# Patient Record
Sex: Male | Born: 1990 | Race: White | Hispanic: No | Marital: Single | Smoking: Former smoker
Health system: Southern US, Community
[De-identification: ages and names within clinical notes are randomized; demographics above are authoritative.]

## PROBLEM LIST (undated history)

## (undated) DIAGNOSIS — R159 Full incontinence of feces: Secondary | ICD-10-CM

## (undated) DIAGNOSIS — F419 Anxiety disorder, unspecified: Secondary | ICD-10-CM

## (undated) DIAGNOSIS — Q431 Hirschsprung's disease: Secondary | ICD-10-CM

## (undated) DIAGNOSIS — A6000 Herpesviral infection of urogenital system, unspecified: Secondary | ICD-10-CM

## (undated) DIAGNOSIS — R569 Unspecified convulsions: Secondary | ICD-10-CM

## (undated) HISTORY — DX: Hirschsprung's disease: Q43.1

## (undated) HISTORY — DX: Anxiety disorder, unspecified: F41.9

## (undated) HISTORY — PX: COLON SURGERY: SHX602

## (undated) HISTORY — DX: Full incontinence of feces: R15.9

## (undated) HISTORY — DX: Herpesviral infection of urogenital system, unspecified: A60.00

---
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
## 2012-08-20 ENCOUNTER — Encounter (HOSPITAL_COMMUNITY): Payer: Self-pay | Admitting: *Deleted

## 2012-08-20 ENCOUNTER — Emergency Department (HOSPITAL_COMMUNITY)
Admission: EM | Admit: 2012-08-20 | Discharge: 2012-08-20 | Disposition: A | Discharge status: Home / Self Care | Payer: BC Managed Care – PPO | Attending: Emergency Medicine | Admitting: Emergency Medicine

## 2012-08-20 ENCOUNTER — Emergency Department (HOSPITAL_COMMUNITY): Payer: BC Managed Care – PPO

## 2012-08-20 DIAGNOSIS — R569 Unspecified convulsions: Secondary | ICD-10-CM

## 2012-08-20 DIAGNOSIS — Z79899 Other long term (current) drug therapy: Secondary | ICD-10-CM | POA: Insufficient documentation

## 2012-08-20 DIAGNOSIS — G40909 Epilepsy, unspecified, not intractable, without status epilepticus: Secondary | ICD-10-CM | POA: Insufficient documentation

## 2012-08-20 DIAGNOSIS — S01111A Laceration without foreign body of right eyelid and periocular area, initial encounter: Secondary | ICD-10-CM

## 2012-08-20 DIAGNOSIS — R5381 Other malaise: Secondary | ICD-10-CM | POA: Insufficient documentation

## 2012-08-20 DIAGNOSIS — S8000XA Contusion of unspecified knee, initial encounter: Secondary | ICD-10-CM | POA: Insufficient documentation

## 2012-08-20 DIAGNOSIS — Y9289 Other specified places as the place of occurrence of the external cause: Secondary | ICD-10-CM | POA: Insufficient documentation

## 2012-08-20 DIAGNOSIS — S0990XA Unspecified injury of head, initial encounter: Secondary | ICD-10-CM | POA: Insufficient documentation

## 2012-08-20 DIAGNOSIS — S058X9A Other injuries of unspecified eye and orbit, initial encounter: Secondary | ICD-10-CM | POA: Insufficient documentation

## 2012-08-20 DIAGNOSIS — Y9301 Activity, walking, marching and hiking: Secondary | ICD-10-CM | POA: Insufficient documentation

## 2012-08-20 DIAGNOSIS — R296 Repeated falls: Secondary | ICD-10-CM | POA: Insufficient documentation

## 2012-08-20 HISTORY — DX: Unspecified convulsions: R56.9

## 2012-08-20 LAB — CBC WITH DIFFERENTIAL/PLATELET
Basophils Absolute: 0 10*3/uL (ref 0.0–0.1)
Basophils Relative: 1 % (ref 0–1)
Eosinophils Absolute: 0.1 10*3/uL (ref 0.0–0.7)
Eosinophils Relative: 2 % (ref 0–5)
Lymphocytes Relative: 25 % (ref 12–46)
Lymphs Abs: 1.4 10*3/uL (ref 0.7–4.0)
Monocytes Absolute: 0.6 10*3/uL (ref 0.1–1.0)
Monocytes Relative: 11 % (ref 3–12)
Neutro Abs: 3.6 10*3/uL (ref 1.7–7.7)
RBC: 5.05 MIL/uL (ref 4.22–5.81)
WBC: 5.8 10*3/uL (ref 4.0–10.5)

## 2012-08-20 LAB — COMPREHENSIVE METABOLIC PANEL
ALT: 36 U/L (ref 0–53)
AST: 42 U/L — ABNORMAL HIGH (ref 0–37)
Albumin: 3.7 g/dL (ref 3.5–5.2)
Alkaline Phosphatase: 45 U/L (ref 39–117)
Creatinine, Ser: 0.94 mg/dL (ref 0.50–1.35)
GFR calc Af Amer: >90 mL/min (ref >90)
GFR calc non Af Amer: >90 mL/min (ref >90)
Glucose, Bld: 89 mg/dL (ref 70–99)
Potassium: 4.3 mEq/L (ref 3.5–5.1)
Total Bilirubin: 0.7 mg/dL (ref 0.3–1.2)

## 2012-08-20 MED ORDER — LEVETIRACETAM 1000 MG PO TABS: 1000.0000 mg | ORAL_TABLET | Freq: Two times a day (BID) | ORAL | Status: DC

## 2012-08-20 MED ORDER — TETANUS-DIPHTH-ACELL PERTUSSIS 5-2.5-18.5 LF-MCG/0.5 IM SUSP
0.5000 mL | Freq: Once | INTRAMUSCULAR | Status: DC
  Filled 2012-08-20: qty 0.5

## 2012-08-20 MED ORDER — LIDOCAINE-EPINEPHRINE-TETRACAINE (LET) SOLUTION
3.0000 mL | Freq: Once | NASAL | Status: AC
  Administered 2012-08-20: 3 mL via TOPICAL
  Filled 2012-08-20: qty 3

## 2012-08-20 MED ORDER — LEVETIRACETAM 500 MG PO TABS: 500.0000 mg | ORAL_TABLET | Freq: Once | ORAL | Status: DC

## 2012-08-20 MED ORDER — TETANUS-DIPHTHERIA TOXOIDS TD 5-2 LFU IM INJ
0.5000 mL | INJECTION | Freq: Once | INTRAMUSCULAR | Status: DC
  Administered 2012-08-20: 0.5 mL via INTRAMUSCULAR

## 2012-08-20 MED ORDER — LEVETIRACETAM 500 MG PO TABS
1000.0000 mg | ORAL_TABLET | Freq: Once | ORAL | Status: AC
  Administered 2012-08-20: 1000 mg via ORAL
  Filled 2012-08-20: qty 2

## 2012-08-20 NOTE — ED Notes (Signed)
Pt reports was at a friends house, got up this morning, was outside and had seizure. Hit concrete, laceration to right eye. Hx of seizures, last one a week ago. Medications changed 3 weeks ago. Pt now taking keppra, has had 3 seizures in last 3 weeks.

## 2012-08-20 NOTE — ED Notes (Signed)
Pt had syncopal episode while starting IV. Pt became pale, diaphoretic, hypotensive and bradycardic. Episode lasted 1-2 minutes. IV started and fluids began.

## 2012-08-20 NOTE — ED Provider Notes (Signed)
Medical screening examination/treatment/procedure(s) were conducted as a shared visit with non-physician practitioner(s) and myself.  I personally evaluated the patient during the encounter  Witnessed seizure with seizure disorder. States misses some vimpat and keppra once in a while. Head trauma after fall with eyelid laceration. EOMI, PERRLA, abrasion R eyelid without tarsal plate involvement. nonfocal neuro exam, baseline mentation.  Med adjustments as recommended by Dr. Roseanne Reno.  Glynn Octave, MD 08/20/12 [phone]

## 2012-08-20 NOTE — ED Notes (Signed)
PA at bedside.

## 2012-08-20 NOTE — ED Notes (Signed)
Pt assisted to restroom via wheelchair

## 2012-08-20 NOTE — ED Notes (Signed)
Pt remains in CT Scan.

## 2012-08-20 NOTE — ED Provider Notes (Signed)
History     CSN: [id number]  Arrival date & time 08/20/12  0736   First MD Initiated Contact with Patient 08/20/12 0740      Chief Complaint  Patient presents with  . Seizures  . Facial Laceration    (Consider location/radiation/quality/duration/timing/severity/associated sxs/prior treatment) HPI Roy Lloyd is a 22 y.o. male who presents to ED after an episode of a seizure. Pt states he spent the night at his friends house. This morning walked out outside where he had an episode of tonic clonic seizure. Hx of seizures, followed by [facility]. States has been on vimpat, and 3 wks ago had keppra added to his medications. Since then had 3 seizures, prior to that only 1 every few months. Pt states today he fell and hit his head and bilateral knees on the ground. Reports abrasions to the face, knees, headache. States feels tired. Denies any other complaints. Pt admits to intermittent non compliance with his medications.    Past Medical History  Diagnosis Date  . Seizures     Past Surgical History  Procedure Laterality Date  . Colon surgery      No family history on file.  History  Substance Use Topics  . Smoking status: Never Smoker   . Smokeless tobacco: Not on file  . Alcohol Use: No      Review of Systems  Constitutional: Positive for fatigue. Negative for fever and chills.  Respiratory: Negative.   Cardiovascular: Negative.   Skin: Positive for wound.  Neurological: Positive for seizures, weakness and headaches.  All other systems reviewed and are negative.    Allergies  Review of patient's allergies indicates no known allergies.  Home Medications   Current Outpatient Rx  Name  Route  Sig  Dispense  Refill  . lacosamide (VIMPAT) 50 MG TABS   Oral   Take 50-100 mg by mouth 2 (two) times daily. 50 mg in the morning and 100 mg at bedtime.         . levETIRAcetam (KEPPRA) 500 MG tablet   Oral   Take 500 mg by mouth 2 (two) times daily. 500 mg twice  daily for 7 days then 550 mg twice daily.           BP 112/55  Pulse 65  Temp(Src) 98 F (36.7 C) (Oral)  Resp 7  SpO2 97%  Physical Exam  Nursing note and vitals reviewed. Constitutional: He is oriented to person, place, and time. He appears well-developed and well-nourished. No distress.  HENT:  Head: Normocephalic.  No tongue injury  Eyes: Conjunctivae and EOM are normal. Pupils are equal, round, and reactive to light.  Neck: Neck supple.  Cardiovascular: Normal rate, regular rhythm and normal heart sounds.   Pulmonary/Chest: Effort normal and breath sounds normal. No respiratory distress. He has no wheezes. He has no rales.  Abdominal: Soft. Bowel sounds are normal. He exhibits no distension. There is no tenderness. There is no rebound.  Musculoskeletal:  Bruising to bilateral knees. Full rom. Tender to palpation over left anterior knee. Joint is stable with negative anterior, posterior drawer signs. No laxity with medial or lateral stress.   Neurological: He is alert and oriented to person, place, and time.  5/5 and equal upper and lower extremity strength bilaterally. Equal grip strength bilaterally. Normal finger to nose and heel to shin. No pronator drift.   Skin: Skin is warm and dry.  Abrasion to the right eyelid, right maxilla, bilateral knees   Psychiatric: He has  a normal mood and affect. His behavior is normal.    ED Course  Procedures (including critical care time)  Results for orders placed during the hospital encounter of 08/20/12  CBC WITH DIFFERENTIAL      Result Value Range   WBC 5.8  4.0 - 10.5 K/uL   RBC 5.05  4.22 - 5.81 MIL/uL   Hemoglobin 15.4  13.0 - 17.0 g/dL   HCT 10.2  72.5 - 36.6 %   MCV 86.9  78.0 - 100.0 fL   MCH 30.5  26.0 - 34.0 pg   MCHC 35.1  30.0 - 36.0 g/dL   RDW 44.0  34.7 - 42.5 %   Platelets 169  150 - 400 K/uL   Neutrophils Relative % 62  43 - 77 %   Neutro Abs 3.6  1.7 - 7.7 K/uL   Lymphocytes Relative 25  12 - 46 %    Lymphs Abs 1.4  0.7 - 4.0 K/uL   Monocytes Relative 11  3 - 12 %   Monocytes Absolute 0.6  0.1 - 1.0 K/uL   Eosinophils Relative 2  0 - 5 %   Eosinophils Absolute 0.1  0.0 - 0.7 K/uL   Basophils Relative 1  0 - 1 %   Basophils Absolute 0.0  0.0 - 0.1 K/uL  COMPREHENSIVE METABOLIC PANEL      Result Value Range   Sodium 137  135 - 145 mEq/L   Potassium 4.3  3.5 - 5.1 mEq/L   Chloride 104  96 - 112 mEq/L   CO2 24  19 - 32 mEq/L   Glucose, Bld 89  70 - 99 mg/dL   BUN 14  6 - 23 mg/dL   Creatinine, Ser 9.56  0.50 - 1.35 mg/dL   Calcium 9.3  8.4 - 38.7 mg/dL   Total Protein 6.4  6.0 - 8.3 g/dL   Albumin 3.7  3.5 - 5.2 g/dL   AST 42 (*) 0 - 37 U/L   ALT 36  0 - 53 U/L   Alkaline Phosphatase 45  39 - 117 U/L   Total Bilirubin 0.7  0.3 - 1.2 mg/dL   GFR calc non Af Amer >90  >90 mL/min   GFR calc Af Amer >90  >90 mL/min   Ct Head Wo Contrast  08/20/2012   *RADIOLOGY REPORT*  Clinical Data: Seizure, facial laceration  CT HEAD WITHOUT CONTRAST,CT ORBITS WITHOUT CONTRAST  Technique:  Contiguous axial images were obtained from the base of the skull through the vertex without contrast.,Technique: Multidetector CT imaging of the orbits was performed following the standard protocol without intravenous contrast.  Comparison: 06/27/2009  Findings: No skull fracture is noted.  Paranasal sinuses and mastoid air cells are unremarkable.  No intracranial hemorrhage, mass effect or midline shift.  No acute infarction.  No mass lesion is noted on this unenhanced scan.  IMPRESSION: No acute intracranial abnormality.  CT orbits without IV contrast.  Axial images shows no facial fractures.  No nasal bone fracture. Mild right preorbital soft tissue swelling.  No facial fluid collection.  No zygomatic fracture.  Coronal reconstructed images shows no orbital rim or orbital floor fracture.  There is left nasal septum deviation.  Bilateral semilunar canal is patent.  Sagittal images shows the nasopharyngeal airway to be  patent.  Bilateral eye globe is symmetrical in appearance.  No intraorbital hematoma.  Impression: 1.  No intraorbital hematoma.  No orbital floor or orbital rim fracture.  Mild right preorbital soft tissue  swelling.  Bilateral eye globe is symmetrical in appearance. 2.  No nasal bone fracture.  No zygomatic fracture.   Original Report Authenticated By: Natasha Mead, M.D.   Dg Knee Complete 4 Views Left  08/20/2012   *RADIOLOGY REPORT*  Clinical Data: Fall and abrasions.  LEFT KNEE - COMPLETE 4+ VIEW  Comparison: 03/18/2006  Findings: Four views of the knee are negative for a fracture or dislocation.  No significant joint effusion.  Normal alignment.  IMPRESSION: No acute abnormality.   Original Report Authenticated By: Richarda Overlie, M.D.   Ct Orbitss W/o Cm  08/20/2012   *RADIOLOGY REPORT*  Clinical Data: Seizure, facial laceration  CT HEAD WITHOUT CONTRAST,CT ORBITS WITHOUT CONTRAST  Technique:  Contiguous axial images were obtained from the base of the skull through the vertex without contrast.,Technique: Multidetector CT imaging of the orbits was performed following the standard protocol without intravenous contrast.  Comparison: 06/27/2009  Findings: No skull fracture is noted.  Paranasal sinuses and mastoid air cells are unremarkable.  No intracranial hemorrhage, mass effect or midline shift.  No acute infarction.  No mass lesion is noted on this unenhanced scan.  IMPRESSION: No acute intracranial abnormality.  CT orbits without IV contrast.  Axial images shows no facial fractures.  No nasal bone fracture. Mild right preorbital soft tissue swelling.  No facial fluid collection.  No zygomatic fracture.  Coronal reconstructed images shows no orbital rim or orbital floor fracture.  There is left nasal septum deviation.  Bilateral semilunar canal is patent.  Sagittal images shows the nasopharyngeal airway to be patent.  Bilateral eye globe is symmetrical in appearance.  No intraorbital hematoma.  Impression: 1.   No intraorbital hematoma.  No orbital floor or orbital rim fracture.  Mild right preorbital soft tissue swelling.  Bilateral eye globe is symmetrical in appearance. 2.  No nasal bone fracture.  No zygomatic fracture.   Original Report Authenticated By: Natasha Mead, M.D.     1. Seizure   2. Minor head injury, initial encounter   3. Laceration of skin of right eyelid       MDM  Pt with hx of seizures, here with increased seizures and one this morning where he fell to the ground. States increased seizures since starting keppra 3 wks ago. Pt does admit non compliance with medications. On exam, no neuro deficits. Laceration repaired with dermabond. CTs head and orbits negative. Tetanus updated. Pt ambulatory with no distress. He is non toxic appearing. Denies alcohol or drug use.   Discussed pt with Dr. Roseanne Reno, neurology. Recommended compliance and increase keppra dose to 1000mg  BID. Discussed with pt and his family. Will d/c home with neurology follow up.   Filed Vitals:   08/20/12 0800 08/20/12 0815 08/20/12 1030 08/20/12 1115  BP: 116/56 112/55 112/59 110/48  Pulse: 58 65 58 58  Temp:    98.6 F (37 C)  TempSrc:    Oral  Resp: 7   18  SpO2: 97% 97% 96% 97%           Cason Luffman A Alnita Aybar, PA-C 08/20/12 1526

## 2015-01-27 ENCOUNTER — Encounter (HOSPITAL_COMMUNITY): Payer: Self-pay | Admitting: Emergency Medicine

## 2015-01-27 ENCOUNTER — Emergency Department (HOSPITAL_COMMUNITY)
Admission: EM | Admit: 2015-01-27 | Discharge: 2015-01-28 | Disposition: A | Discharge status: Home / Self Care | Payer: 59 | Attending: Emergency Medicine | Admitting: Emergency Medicine

## 2015-01-27 ENCOUNTER — Emergency Department (HOSPITAL_COMMUNITY): Payer: 59

## 2015-01-27 DIAGNOSIS — G40909 Epilepsy, unspecified, not intractable, without status epilepticus: Secondary | ICD-10-CM | POA: Insufficient documentation

## 2015-01-27 DIAGNOSIS — R000 Tachycardia, unspecified: Secondary | ICD-10-CM | POA: Diagnosis not present

## 2015-01-27 DIAGNOSIS — J159 Unspecified bacterial pneumonia: Secondary | ICD-10-CM | POA: Diagnosis not present

## 2015-01-27 DIAGNOSIS — E876 Hypokalemia: Secondary | ICD-10-CM

## 2015-01-27 DIAGNOSIS — Z79899 Other long term (current) drug therapy: Secondary | ICD-10-CM | POA: Insufficient documentation

## 2015-01-27 DIAGNOSIS — J189 Pneumonia, unspecified organism: Secondary | ICD-10-CM

## 2015-01-27 DIAGNOSIS — R509 Fever, unspecified: Secondary | ICD-10-CM | POA: Diagnosis present

## 2015-01-27 LAB — COMPREHENSIVE METABOLIC PANEL
ALT: 34 U/L (ref 17–63)
ANION GAP: 9 (ref 5–15)
AST: 25 U/L (ref 15–41)
Albumin: 3.6 g/dL (ref 3.5–5.0)
Alkaline Phosphatase: 47 U/L (ref 38–126)
BUN: 6 mg/dL (ref 6–20)
CALCIUM: 8.9 mg/dL (ref 8.9–10.3)
CHLORIDE: 101 mmol/L (ref 101–111)
CO2: 28 mmol/L (ref 22–32)
Creatinine, Ser: 1.03 mg/dL (ref 0.61–1.24)
GFR calc non Af Amer: >60 mL/min (ref >60)
Glucose, Bld: 111 mg/dL — ABNORMAL HIGH (ref 65–99)
Potassium: 3.1 mmol/L — ABNORMAL LOW (ref 3.5–5.1)
SODIUM: 138 mmol/L (ref 135–145)
Total Bilirubin: 0.6 mg/dL (ref 0.3–1.2)
Total Protein: 6.8 g/dL (ref 6.5–8.1)

## 2015-01-27 LAB — CBC WITH DIFFERENTIAL/PLATELET
BASOS PCT: 0 %
Basophils Absolute: 0 10*3/uL (ref 0.0–0.1)
Eosinophils Absolute: 0.1 10*3/uL (ref 0.0–0.7)
Eosinophils Relative: 1 %
HEMATOCRIT: 38.4 % — AB (ref 39.0–52.0)
HEMOGLOBIN: 12.7 g/dL — AB (ref 13.0–17.0)
LYMPHS ABS: 1.4 10*3/uL (ref 0.7–4.0)
Lymphocytes Relative: 16 %
MCH: 30.6 pg (ref 26.0–34.0)
MCHC: 33.1 g/dL (ref 30.0–36.0)
MCV: 92.5 fL (ref 78.0–100.0)
MONOS PCT: 9 %
Monocytes Absolute: 0.8 10*3/uL (ref 0.1–1.0)
NEUTROS ABS: 6.8 10*3/uL (ref 1.7–7.7)
NEUTROS PCT: 74 %
Platelets: 212 10*3/uL (ref 150–400)
RBC: 4.15 MIL/uL — ABNORMAL LOW (ref 4.22–5.81)
RDW: 12.7 % (ref 11.5–15.5)
WBC: 9.1 10*3/uL (ref 4.0–10.5)

## 2015-01-27 LAB — URINALYSIS, ROUTINE W REFLEX MICROSCOPIC
BILIRUBIN URINE: NEGATIVE
Glucose, UA: NEGATIVE mg/dL
Hgb urine dipstick: NEGATIVE
Ketones, ur: NEGATIVE mg/dL
LEUKOCYTES UA: NEGATIVE
NITRITE: NEGATIVE
PH: 7 (ref 5.0–8.0)
Protein, ur: NEGATIVE mg/dL
SPECIFIC GRAVITY, URINE: 1.023 (ref 1.005–1.030)

## 2015-01-27 LAB — I-STAT CG4 LACTIC ACID, ED: Lactic Acid, Venous: 1.83 mmol/L (ref 0.5–2.0)

## 2015-01-27 MED ORDER — CEFTRIAXONE SODIUM 1 G IJ SOLR
1.0000 g | Freq: Once | INTRAMUSCULAR | Status: AC
  Administered 2015-01-27: 1 g via INTRAVENOUS
  Filled 2015-01-27: qty 10

## 2015-01-27 MED ORDER — POTASSIUM CHLORIDE CRYS ER 20 MEQ PO TBCR
40.0000 meq | EXTENDED_RELEASE_TABLET | Freq: Once | ORAL | Status: AC
  Administered 2015-01-27: 40 meq via ORAL
  Filled 2015-01-27: qty 2

## 2015-01-27 MED ORDER — DEXTROSE 5 % IV SOLN
500.0000 mg | Freq: Once | INTRAVENOUS | Status: AC
  Administered 2015-01-27: 500 mg via INTRAVENOUS
  Filled 2015-01-27: qty 500

## 2015-01-27 MED ORDER — DEXTROSE 5 % IV SOLN: 500.0000 mg | INTRAVENOUS | Status: DC

## 2015-01-27 MED ORDER — DEXTROSE 5 % IV SOLN: 1.0000 g | INTRAVENOUS | Status: DC

## 2015-01-27 MED ORDER — SODIUM CHLORIDE 0.9 % IV BOLUS (SEPSIS)
1000.0000 mL | INTRAVENOUS | Status: AC
  Administered 2015-01-27 (×3): 1000 mL via INTRAVENOUS

## 2015-01-27 NOTE — ED Notes (Signed)
PA at bedside.

## 2015-01-27 NOTE — ED Notes (Signed)
C/o productive cough with yellow phlegm, sob, and fever x 4 days.  States initially had sore throat that has resolved.  Last took Tylenol 1000mg  30 min pta.

## 2015-01-27 NOTE — ED Provider Notes (Signed)
CSN: [id number]     Arrival date & time 01/27/15  2109 History   First MD Initiated Contact with Patient 01/27/15 2139     Chief Complaint  Patient presents with  . Cough  . Shortness of Breath  . Fever     (Consider location/radiation/quality/duration/timing/severity/associated sxs/prior Treatment) The history is provided by the patient and medical records. No language interpreter was used.     Roy Lloyd is a 24 y.o. male  with a hx of seizures presents to the Emergency Department complaining of gradual, persistent, progressively worsening cough productive of yellow phlem onset 4 days ago. Associated symptoms include SOB, fever, sore throat (resolved).  Pt reports difficulty sleeping due to cough.  Pt reports exposure to people with PNA and bronchitis in the last week. He reports taking ibuprofen at home with moderate fever relief. He reports palpitations, general illness.  Coughing seems to make his shortness of breath worse.  Pt denies chest pain, abdominal pain, nausea, vomiting, diarrhea, weakness, dizziness, syncope, dysuria, neck pain, neck stiffness.    Strep and Influenza test negative on Tues 01/22/15 at PCP.  Pt reports PCP called in amoxicillin with first dose on 01/24/15.   he has taken this as directed.    Past Medical History  Diagnosis Date  . Seizures [facility])    Past Surgical History  Procedure Laterality Date  . Colon surgery     No family history on file. Social History  Substance Use Topics  . Smoking status: Never Smoker   . Smokeless tobacco: None  . Alcohol Use: No    Review of Systems  Constitutional: Positive for fever and fatigue. Negative for chills, diaphoresis, appetite change and unexpected weight change.  HENT: Positive for congestion, postnasal drip, rhinorrhea, sinus pressure and sore throat. Negative for ear discharge, ear pain and mouth sores.   Eyes: Negative for visual disturbance.  Respiratory: Positive for cough, chest tightness and  shortness of breath. Negative for wheezing and stridor.   Cardiovascular: Negative for chest pain, palpitations and leg swelling.  Gastrointestinal: Negative for nausea, vomiting, abdominal pain, diarrhea and constipation.  Endocrine: Negative for polydipsia, polyphagia and polyuria.  Genitourinary: Negative for dysuria, urgency, frequency and hematuria.  Musculoskeletal: Negative for myalgias, back pain, arthralgias and neck stiffness.  Skin: Negative for rash.  Allergic/Immunologic: Negative for immunocompromised state.  Neurological: Positive for headaches ( generalized, throbbing). Negative for syncope, light-headedness and numbness.  Hematological: Negative for adenopathy. Does not bruise/bleed easily.  Psychiatric/Behavioral: Negative for sleep disturbance. The patient is not nervous/anxious.   All other systems reviewed and are negative.     Allergies  Review of patient's allergies indicates no known allergies.  Home Medications   Prior to Admission medications   Medication Sig Start Date End Date Taking? Authorizing Provider  lacosamide (VIMPAT) 50 MG TABS Take 50-100 mg by mouth 2 (two) times daily. 50 mg in the morning and 100 mg at bedtime.   Yes Historical Provider, MD  levETIRAcetam (KEPPRA) 500 MG tablet Take 500 mg by mouth See admin instructions. 500 mg in the morning and 40981 mg in the evening.   Yes Historical Provider, MD  azithromycin (ZITHROMAX) 250 MG tablet Take 1 tablet (250 mg total) by mouth daily. Take first 2 tablets together, then 1 every day until finished. 01/28/15   Wyett Narine, PA-C   BP 113/60 mmHg  Pulse 113  Temp(Src) 99.4 F (37.4 C) (Oral)  Resp 22  Wt 191 lb (86.637 kg)  SpO2 96% Physical  Exam  Constitutional: He is oriented to person, place, and time. He appears well-developed and well-nourished. No distress.  HENT:  Head: Normocephalic and atraumatic.  Right Ear: Tympanic membrane, external ear and ear canal normal.  Left Ear:  Tympanic membrane, external ear and ear canal normal.  Nose: Mucosal edema and rhinorrhea present. No epistaxis. Right sinus exhibits no maxillary sinus tenderness and no frontal sinus tenderness. Left sinus exhibits no maxillary sinus tenderness and no frontal sinus tenderness.  Mouth/Throat: Uvula is midline and mucous membranes are normal. Mucous membranes are not pale and not cyanotic. No oropharyngeal exudate, posterior oropharyngeal edema, posterior oropharyngeal erythema or tonsillar abscesses.  Eyes: Conjunctivae are normal. Pupils are equal, round, and reactive to light.  Neck: Normal range of motion and full passive range of motion without pain.  Cardiovascular: Regular rhythm, S1 normal, S2 normal, normal heart sounds and intact distal pulses.  Tachycardia present.   Pulses:      Radial pulses are 2+ on the right side, and 2+ on the left side.       Dorsalis pedis pulses are 2+ on the right side, and 2+ on the left side.  Pulmonary/Chest: Effort normal. No accessory muscle usage or stridor. Tachypnea noted. No respiratory distress. He has decreased breath sounds in the right middle field and the right lower field. He has no wheezes. He has no rhonchi. He has no rales. He exhibits no tenderness.  Focally decreased breath sounds in the right middle and right lower lobe  Abdominal: Soft. Bowel sounds are normal. He exhibits no distension. There is no tenderness.  Musculoskeletal: Normal range of motion. He exhibits no edema.  Lymphadenopathy:    He has no cervical adenopathy.  Neurological: He is alert and oriented to person, place, and time.  Skin: Skin is warm and dry. No rash noted. He is not diaphoretic.  Psychiatric: He has a normal mood and affect.  Nursing note and vitals reviewed.   ED Course  Procedures (including critical care time) Labs Review Labs Reviewed  COMPREHENSIVE METABOLIC PANEL - Abnormal; Notable for the following:    Potassium 3.1 (*)    Glucose, Bld 111 (*)     All other components within normal limits  CBC WITH DIFFERENTIAL/PLATELET - Abnormal; Notable for the following:    RBC 4.15 (*)    Hemoglobin 12.7 (*)    HCT 38.4 (*)    All other components within normal limits  URINE CULTURE  CULTURE, BLOOD (ROUTINE X 2)  CULTURE, BLOOD (ROUTINE X 2)  URINALYSIS, ROUTINE W REFLEX MICROSCOPIC (NOT AT [facility])  I-STAT CG4 LACTIC ACID, ED  I-STAT CG4 LACTIC ACID, ED    Imaging Review Dg Chest 2 View  01/27/2015  CLINICAL DATA:  Acute onset of productive cough, with shortness of breath, fever and sore throat. Initial encounter. EXAM: CHEST  2 VIEW COMPARISON:  None. FINDINGS: The lungs are well-aerated. Right middle lobe and left lower lobe opacities are compatible with multifocal pneumonia. There is no evidence of pleural effusion or pneumothorax. The heart is normal in size; the mediastinal contour is within normal limits. No acute osseous abnormalities are seen. IMPRESSION: Right middle lobe and left lower lobe airspace opacities, compatible with multifocal pneumonia. Electronically Signed   By: Roanna RaiderJeffery  Chang M.D.   On: 01/27/2015 21:45   I have personally reviewed and evaluated these images and lab results as part of my medical decision-making.   MDM   Final diagnoses:  CAP (community acquired pneumonia)  Tachycardia  Hypokalemia    Otho Darner presents with fever, tachycardia, cough.  No leukocytosis, mild hypokalemia repleted in the emergency department. Chest x-ray with multifocal pneumonia. Lactic acid within normal limits.  Patient given a bolus, Rocephin, azithromycin and albuterol here in the emergency department with resolution of his fever. Mild tachycardia persists however he remains without hypoxia and tachypnea has resolved. Patient appears much better and reports feeling much better.  Patient with a community-acquired pneumonia. We'll discharge home with azithromycin and albuterol. Discussed reasons to return to the emergency  department including worsening fevers, crease difficult breathing or other concerns.  No evidence of SIRS or sepsis.    BP 113/60 mmHg  Pulse 113  Temp(Src) 99.4 F (37.4 C) (Oral)  Resp 22  Wt 191 lb (86.637 kg)  SpO2 96%   Dierdre Forth, PA-C 01/28/15 1610  Doug Sou, MD 01/28/15 1454

## 2015-01-28 LAB — URINE CULTURE: CULTURE: NO GROWTH

## 2015-01-28 LAB — I-STAT CG4 LACTIC ACID, ED: Lactic Acid, Venous: 1.74 mmol/L (ref 0.5–2.0)

## 2015-01-28 MED ORDER — ALBUTEROL SULFATE HFA 108 (90 BASE) MCG/ACT IN AERS
2.0000 | INHALATION_SPRAY | RESPIRATORY_TRACT | Status: DC | PRN
  Administered 2015-01-28: 2 via RESPIRATORY_TRACT
  Filled 2015-01-28: qty 6.7

## 2015-01-28 MED ORDER — AZITHROMYCIN 250 MG PO TABS: 250.0000 mg | ORAL_TABLET | Freq: Every day | ORAL | Status: DC

## 2015-01-28 MED ORDER — AEROCHAMBER PLUS W/MASK MISC
1.0000 | Freq: Once | Status: AC
  Administered 2015-01-28: 1
  Filled 2015-01-28: qty 1

## 2015-01-28 MED ORDER — ALBUTEROL SULFATE (2.5 MG/3ML) 0.083% IN NEBU
5.0000 mg | INHALATION_SOLUTION | Freq: Once | RESPIRATORY_TRACT | Status: AC
  Administered 2015-01-28: 5 mg via RESPIRATORY_TRACT
  Filled 2015-01-28: qty 6

## 2015-01-28 NOTE — Discharge Instructions (Signed)
1. Medications: Azithromycin, albuterol as needed for shortness of breath/chest tightness/wheezing, usual home medications 2. Treatment: rest, drink plenty of fluids 3. Follow Up: Please followup with your primary doctor in 3 days for discussion of your diagnoses and further evaluation after today's visit; if you do not have a primary care doctor use the resource guide provided to find one; Please return to the ER for worsening symptoms, difficult breathing or other concerns    Community-Acquired Pneumonia, Adult Pneumonia is an infection of the lungs. There are different types of pneumonia. One type can develop while a person is in a hospital. A different type, called community-acquired pneumonia, develops in people who are not, or have not recently been, in the hospital or other health care facility.  CAUSES Pneumonia may be caused by bacteria, viruses, or funguses. Community-acquired pneumonia is often caused by Streptococcus pneumonia bacteria. These bacteria are often passed from one person to another by breathing in droplets from the cough or sneeze of an infected person. RISK FACTORS The condition is more likely to develop in:  People who havechronic diseases, such as chronic obstructive pulmonary disease (COPD), asthma, congestive heart failure, cystic fibrosis, diabetes, or kidney disease.  People who haveearly-stage or late-stage HIV.  People who havesickle cell disease.  People who havehad their spleen removed (splenectomy).  People who havepoor Administrator.  People who havemedical conditions that increase the risk of breathing in (aspirating) secretions their own mouth and nose.   People who havea weakened immune system (immunocompromised).  People who smoke.  People whotravel to areas where pneumonia-causing germs commonly exist.  People whoare around animal habitats or animals that have pneumonia-causing germs, including birds, bats, rabbits, cats, and farm  animals. SYMPTOMS Symptoms of this condition include:  Adry cough.  A wet (productive) cough.  Fever.  Sweating.  Chest pain, especially when breathing deeply or coughing.  Rapid breathing or difficulty breathing.  Shortness of breath.  Shaking chills.  Fatigue.  Muscle aches. DIAGNOSIS Your health care provider will take a medical history and perform a physical exam. You may also have other tests, including:  Imaging studies of your chest, including X-rays.  Tests to check your blood oxygen level and other blood gases.  Other tests on blood, mucus (sputum), fluid around your lungs (pleural fluid), and urine. If your pneumonia is severe, other tests may be done to identify the specific cause of your illness. TREATMENT The type of treatment that you receive depends on many factors, such as the cause of your pneumonia, the medicines you take, and other medical conditions that you have. For most adults, treatment and recovery from pneumonia may occur at home. In some cases, treatment must happen in a hospital. Treatment may include:  Antibiotic medicines, if the pneumonia was caused by bacteria.  Antiviral medicines, if the pneumonia was caused by a virus.  Medicines that are given by mouth or through an IV tube.  Oxygen.  Respiratory therapy. Although rare, treating severe pneumonia may include:  Mechanical ventilation. This is done if you are not breathing well on your own and you cannot maintain a safe blood oxygen level.  Thoracentesis. This procedureremoves fluid around one lung or both lungs to help you breathe better. HOME CARE INSTRUCTIONS  Take over-the-counter and prescription medicines only as told by your health care provider.  Only takecough medicine if you are losing sleep. Understand that cough medicine can prevent your body's natural ability to remove mucus from your lungs.  If you were prescribed  an antibiotic medicine, take it as told by your  health care provider. Do not stop taking the antibiotic even if you start to feel better.  Sleep in a semi-upright position at night. Try sleeping in a reclining chair, or place a few pillows under your head.  Do not use tobacco products, including cigarettes, chewing tobacco, and e-cigarettes. If you need help quitting, ask your health care provider.  Drink enough water to keep your urine clear or pale yellow. This will help to thin out mucus secretions in your lungs. PREVENTION There are ways that you can decrease your risk of developing community-acquired pneumonia. Consider getting a pneumococcal vaccine if:  You are older than 23 years of age.  You are older than 23 years of age and are undergoing cancer treatment, have chronic lung disease, or have other medical conditions that affect your immune system. Ask your health care provider if this applies to you. There are different types and schedules of pneumococcal vaccines. Ask your health care provider which vaccination option is best for you. You may also prevent community-acquired pneumonia if you take these actions:  Get an influenza vaccine every year. Ask your health care provider which type of influenza vaccine is best for you.  Go to the dentist on a regular basis.  Wash your hands often. Use hand sanitizer if soap and water are not available. SEEK MEDICAL CARE IF:  You have a fever.  You are losing sleep because you cannot control your cough with cough medicine. SEEK IMMEDIATE MEDICAL CARE IF:  You have worsening shortness of breath.  You have increased chest pain.  Your sickness becomes worse, especially if you are an older adult or have a weakened immune system.  You cough up blood.   This information is not intended to replace advice given to you by your health care provider. Make sure you discuss any questions you have with your health care provider.   Document Released: 02/23/2005 Document Revised: 11/14/2014  Document Reviewed: 06/20/2014 Elsevier Interactive Patient Education [employer]2016 [employer] Inc.

## 2015-02-01 LAB — CULTURE, BLOOD (ROUTINE X 2)
CULTURE: NO GROWTH
Culture: NO GROWTH

## 2015-09-14 ENCOUNTER — Encounter (HOSPITAL_COMMUNITY): Payer: Self-pay | Admitting: *Deleted

## 2015-09-14 ENCOUNTER — Emergency Department (HOSPITAL_COMMUNITY)
Admission: EM | Admit: 2015-09-14 | Discharge: 2015-09-15 | Disposition: A | Discharge status: Home / Self Care | Payer: Self-pay | Attending: Emergency Medicine | Admitting: Emergency Medicine

## 2015-09-14 DIAGNOSIS — Y999 Unspecified external cause status: Secondary | ICD-10-CM | POA: Insufficient documentation

## 2015-09-14 DIAGNOSIS — Y939 Activity, unspecified: Secondary | ICD-10-CM | POA: Insufficient documentation

## 2015-09-14 DIAGNOSIS — R51 Headache: Secondary | ICD-10-CM

## 2015-09-14 DIAGNOSIS — R519 Headache, unspecified: Secondary | ICD-10-CM

## 2015-09-14 DIAGNOSIS — X58XXXA Exposure to other specified factors, initial encounter: Secondary | ICD-10-CM | POA: Insufficient documentation

## 2015-09-14 DIAGNOSIS — Y92002 Bathroom of unspecified non-institutional (private) residence single-family (private) house as the place of occurrence of the external cause: Secondary | ICD-10-CM | POA: Insufficient documentation

## 2015-09-14 DIAGNOSIS — S0093XA Contusion of unspecified part of head, initial encounter: Secondary | ICD-10-CM | POA: Insufficient documentation

## 2015-09-14 DIAGNOSIS — G40909 Epilepsy, unspecified, not intractable, without status epilepticus: Secondary | ICD-10-CM | POA: Insufficient documentation

## 2015-09-14 NOTE — ED Notes (Signed)
Pt c/o migraine headache and has been vomiting. Pt says that he also has been having abdominal bloating for several days and abdominal pain today, that resolved after vomiting. Pt states he drank 1 40 oz beer today.
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
## 2015-09-15 ENCOUNTER — Emergency Department (HOSPITAL_COMMUNITY): Payer: Self-pay

## 2015-09-15 LAB — COMPREHENSIVE METABOLIC PANEL
ALK PHOS: 32 U/L — AB (ref 38–126)
ALT: 37 U/L (ref 17–63)
AST: 31 U/L (ref 15–41)
Albumin: 3.5 g/dL (ref 3.5–5.0)
Anion gap: 9 (ref 5–15)
BILIRUBIN TOTAL: 0.7 mg/dL (ref 0.3–1.2)
BUN: 10 mg/dL (ref 6–20)
CO2: 25 mmol/L (ref 22–32)
Calcium: 9.1 mg/dL (ref 8.9–10.3)
Chloride: 103 mmol/L (ref 101–111)
Creatinine, Ser: 1.6 mg/dL — ABNORMAL HIGH (ref 0.61–1.24)
GFR, EST NON AFRICAN AMERICAN: 59 mL/min — AB (ref >60)
Glucose, Bld: 94 mg/dL (ref 65–99)
Potassium: 4 mmol/L (ref 3.5–5.1)
SODIUM: 137 mmol/L (ref 135–145)
Total Protein: 6.5 g/dL (ref 6.5–8.1)

## 2015-09-15 LAB — CBC
HCT: 46.6 % (ref 39.0–52.0)
HEMOGLOBIN: 15.2 g/dL (ref 13.0–17.0)
MCH: 30.6 pg (ref 26.0–34.0)
MCHC: 32.6 g/dL (ref 30.0–36.0)
MCV: 93.8 fL (ref 78.0–100.0)
Platelets: 240 10*3/uL (ref 150–400)
RBC: 4.97 MIL/uL (ref 4.22–5.81)
RDW: 14 % (ref 11.5–15.5)
WBC: 9 10*3/uL (ref 4.0–10.5)

## 2015-09-15 LAB — ETHANOL: Alcohol, Ethyl (B): <5 mg/dL (ref <5)

## 2015-09-15 LAB — LIPASE, BLOOD: LIPASE: 22 U/L (ref 11–51)

## 2015-09-15 MED ORDER — SODIUM CHLORIDE 0.9 % IV BOLUS (SEPSIS)
1000.0000 mL | Freq: Once | INTRAVENOUS | Status: AC
  Administered 2015-09-15: 1000 mL via INTRAVENOUS

## 2015-09-15 MED ORDER — METOCLOPRAMIDE HCL 5 MG/ML IJ SOLN
10.0000 mg | Freq: Once | INTRAMUSCULAR | Status: AC
  Administered 2015-09-15: 10 mg via INTRAVENOUS
  Filled 2015-09-15: qty 2

## 2015-09-15 MED ORDER — DIPHENHYDRAMINE HCL 50 MG/ML IJ SOLN
25.0000 mg | Freq: Once | INTRAMUSCULAR | Status: AC
  Administered 2015-09-15: 25 mg via INTRAVENOUS
  Filled 2015-09-15: qty 1

## 2015-09-15 MED ORDER — ONDANSETRON HCL 4 MG/2ML IJ SOLN
4.0000 mg | Freq: Once | INTRAMUSCULAR | Status: AC
Start: 2015-09-15 — End: 2015-09-15
  Administered 2015-09-15: 4 mg via INTRAVENOUS
  Filled 2015-09-15: qty 2

## 2015-09-15 NOTE — Discharge Instructions (Signed)

## 2016-06-25 ENCOUNTER — Emergency Department (HOSPITAL_COMMUNITY)
Admission: EM | Admit: 2016-06-25 | Discharge: 2016-06-25 | Disposition: A | Discharge status: Home / Self Care | Payer: No Typology Code available for payment source | Attending: Emergency Medicine | Admitting: Emergency Medicine

## 2016-06-25 ENCOUNTER — Emergency Department (HOSPITAL_COMMUNITY): Payer: No Typology Code available for payment source

## 2016-06-25 ENCOUNTER — Encounter (HOSPITAL_COMMUNITY): Payer: Self-pay | Admitting: Emergency Medicine

## 2016-06-25 DIAGNOSIS — Y999 Unspecified external cause status: Secondary | ICD-10-CM | POA: Diagnosis not present

## 2016-06-25 DIAGNOSIS — R569 Unspecified convulsions: Secondary | ICD-10-CM | POA: Diagnosis not present

## 2016-06-25 DIAGNOSIS — Y939 Activity, unspecified: Secondary | ICD-10-CM | POA: Insufficient documentation

## 2016-06-25 DIAGNOSIS — S99921A Unspecified injury of right foot, initial encounter: Secondary | ICD-10-CM | POA: Diagnosis present

## 2016-06-25 DIAGNOSIS — M79671 Pain in right foot: Secondary | ICD-10-CM

## 2016-06-25 DIAGNOSIS — Y92410 Unspecified street and highway as the place of occurrence of the external cause: Secondary | ICD-10-CM | POA: Insufficient documentation

## 2016-06-25 DIAGNOSIS — F17210 Nicotine dependence, cigarettes, uncomplicated: Secondary | ICD-10-CM | POA: Diagnosis not present

## 2016-06-25 LAB — I-STAT CHEM 8, ED
BUN: 11 mg/dL (ref 6–20)
CHLORIDE: 103 mmol/L (ref 101–111)
CREATININE: 1.2 mg/dL (ref 0.61–1.24)
Calcium, Ion: 1.17 mmol/L (ref 1.15–1.40)
GLUCOSE: 100 mg/dL — AB (ref 65–99)
HEMATOCRIT: 47 % (ref 39.0–52.0)
HEMOGLOBIN: 16 g/dL (ref 13.0–17.0)
Potassium: 4.5 mmol/L (ref 3.5–5.1)
Sodium: 136 mmol/L (ref 135–145)
TCO2: 19 mmol/L (ref 0–100)

## 2016-06-25 MED ORDER — ACETAMINOPHEN 325 MG PO TABS
650.0000 mg | ORAL_TABLET | Freq: Once | ORAL | Status: AC
  Administered 2016-06-25: 650 mg via ORAL
  Filled 2016-06-25: qty 2

## 2016-06-25 MED ORDER — IBUPROFEN 400 MG PO TABS
600.0000 mg | ORAL_TABLET | Freq: Once | ORAL | Status: AC
  Administered 2016-06-25: 10:00:00 600 mg via ORAL
  Filled 2016-06-25: qty 1

## 2016-06-25 MED ORDER — IBUPROFEN 600 MG PO TABS: 600.0000 mg | ORAL_TABLET | Freq: Three times a day (TID) | ORAL | 0 refills | Status: DC | PRN

## 2016-06-25 MED ORDER — SODIUM CHLORIDE 0.9 % IV BOLUS (SEPSIS)
1000.0000 mL | Freq: Once | INTRAVENOUS | Status: AC
  Administered 2016-06-25: 1000 mL via INTRAVENOUS

## 2016-06-25 NOTE — ED Notes (Signed)
Pt returned to room from imaging department.  
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
## 2016-06-25 NOTE — Progress Notes (Signed)
Orthopedic Tech Progress Note Patient Details:  Roy Lloyd 1990/04/25 [id number]  Ortho Devices Type of Ortho Device: CAM walker, Crutches Ortho Device/Splint Location: rle Ortho Device/Splint Interventions: Application   Treyvonne Tata 06/25/2016, 11:07 AM

## 2016-06-25 NOTE — ED Notes (Signed)
Pt stated he needed to urinate. Pt was asked if he wanted to  Use urinal or walk to restroom. Pt attempt to stand on right foot, was unable. Pt sat back down to use urinal. Doctor and nurse made aware.
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
## 2016-06-25 NOTE — Discharge Instructions (Signed)
No weight bearing to the right lower leg. Use the crutches. Please call the orthopedic surgeon for follow up.   DO NOT drive until you follow up with your neurologist

## 2016-06-26 ENCOUNTER — Encounter (HOSPITAL_COMMUNITY): Payer: Self-pay | Admitting: *Deleted

## 2016-07-17 ENCOUNTER — Inpatient Hospital Stay: Payer: Self-pay
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
## 2016-08-21 ENCOUNTER — Emergency Department (HOSPITAL_COMMUNITY): Payer: Self-pay

## 2016-08-21 ENCOUNTER — Emergency Department (HOSPITAL_COMMUNITY)
Admission: EM | Admit: 2016-08-21 | Discharge: 2016-08-21 | Disposition: A | Discharge status: Home / Self Care | Payer: Self-pay | Attending: Emergency Medicine | Admitting: Emergency Medicine

## 2016-08-21 ENCOUNTER — Encounter (HOSPITAL_COMMUNITY): Payer: Self-pay | Admitting: *Deleted

## 2016-08-21 DIAGNOSIS — Z79899 Other long term (current) drug therapy: Secondary | ICD-10-CM | POA: Insufficient documentation

## 2016-08-21 DIAGNOSIS — F17210 Nicotine dependence, cigarettes, uncomplicated: Secondary | ICD-10-CM | POA: Insufficient documentation

## 2016-08-21 DIAGNOSIS — R1013 Epigastric pain: Secondary | ICD-10-CM | POA: Insufficient documentation

## 2016-08-21 LAB — URINALYSIS, ROUTINE W REFLEX MICROSCOPIC
GLUCOSE, UA: NEGATIVE mg/dL
Hgb urine dipstick: NEGATIVE
Ketones, ur: 80 mg/dL — AB
LEUKOCYTES UA: NEGATIVE
Nitrite: NEGATIVE
PROTEIN: 100 mg/dL — AB
SQUAMOUS EPITHELIAL / LPF: NONE SEEN
Specific Gravity, Urine: 1.033 — ABNORMAL HIGH (ref 1.005–1.030)
pH: 6 (ref 5.0–8.0)

## 2016-08-21 LAB — COMPREHENSIVE METABOLIC PANEL
ALK PHOS: 60 U/L (ref 38–126)
ALT: 16 U/L — AB (ref 17–63)
AST: 25 U/L (ref 15–41)
Albumin: 5 g/dL (ref 3.5–5.0)
Anion gap: 16 — ABNORMAL HIGH (ref 5–15)
BILIRUBIN TOTAL: 1.9 mg/dL — AB (ref 0.3–1.2)
BUN: 17 mg/dL (ref 6–20)
CALCIUM: 10.1 mg/dL (ref 8.9–10.3)
CHLORIDE: 103 mmol/L (ref 101–111)
CO2: 19 mmol/L — ABNORMAL LOW (ref 22–32)
CREATININE: 1.4 mg/dL — AB (ref 0.61–1.24)
GFR calc Af Amer: >60 mL/min (ref >60)
Glucose, Bld: 99 mg/dL (ref 65–99)
Potassium: 3.4 mmol/L — ABNORMAL LOW (ref 3.5–5.1)
Sodium: 138 mmol/L (ref 135–145)
TOTAL PROTEIN: 8 g/dL (ref 6.5–8.1)

## 2016-08-21 LAB — I-STAT CG4 LACTIC ACID, ED: LACTIC ACID, VENOUS: 1.69 mmol/L (ref 0.5–1.9)

## 2016-08-21 LAB — RAPID URINE DRUG SCREEN, HOSP PERFORMED
Amphetamines: NOT DETECTED
BARBITURATES: NOT DETECTED
BENZODIAZEPINES: NOT DETECTED
COCAINE: NOT DETECTED
Opiates: NOT DETECTED
Tetrahydrocannabinol: POSITIVE — AB

## 2016-08-21 LAB — CBC
HCT: 44.9 % (ref 39.0–52.0)
Hemoglobin: 16.4 g/dL (ref 13.0–17.0)
MCH: 31.6 pg (ref 26.0–34.0)
MCHC: 36.5 g/dL — ABNORMAL HIGH (ref 30.0–36.0)
MCV: 86.5 fL (ref 78.0–100.0)
PLATELETS: 264 10*3/uL (ref 150–400)
RBC: 5.19 MIL/uL (ref 4.22–5.81)
RDW: 11.8 % (ref 11.5–15.5)
WBC: 13 10*3/uL — AB (ref 4.0–10.5)

## 2016-08-21 LAB — LIPASE, BLOOD: Lipase: 26 U/L (ref 11–51)

## 2016-08-21 LAB — ETHANOL: Alcohol, Ethyl (B): <5 mg/dL (ref <5)

## 2016-08-21 MED ORDER — ONDANSETRON 4 MG PO TBDP
ORAL_TABLET | ORAL | Status: AC
  Filled 2016-08-21: qty 1

## 2016-08-21 MED ORDER — SODIUM CHLORIDE 0.9 % IV BOLUS (SEPSIS)
1000.0000 mL | Freq: Once | INTRAVENOUS | Status: AC
  Administered 2016-08-21: 1000 mL via INTRAVENOUS

## 2016-08-21 MED ORDER — ONDANSETRON HCL 4 MG PO TABS: 4.0000 mg | ORAL_TABLET | Freq: Three times a day (TID) | ORAL | 0 refills | Status: DC | PRN

## 2016-08-21 MED ORDER — OXYCODONE-ACETAMINOPHEN 5-325 MG PO TABS
ORAL_TABLET | ORAL | Status: AC
  Filled 2016-08-21: qty 1

## 2016-08-21 MED ORDER — DEXLANSOPRAZOLE 30 MG PO CPDR: 30.0000 mg | DELAYED_RELEASE_CAPSULE | Freq: Every day | ORAL | 1 refills | Status: DC

## 2016-08-21 MED ORDER — DICYCLOMINE HCL 20 MG PO TABS: 20.0000 mg | ORAL_TABLET | Freq: Two times a day (BID) | ORAL | 0 refills | Status: DC

## 2016-08-21 MED ORDER — SUCRALFATE 1 G PO TABS: 1.0000 g | ORAL_TABLET | Freq: Three times a day (TID) | ORAL | 1 refills | Status: DC

## 2016-08-21 MED ORDER — IOPAMIDOL (ISOVUE-300) INJECTION 61%
INTRAVENOUS | Status: AC
  Administered 2016-08-21: 100 mL
  Filled 2016-08-21: qty 100

## 2016-08-21 MED ORDER — LORAZEPAM 2 MG/ML IJ SOLN
1.0000 mg | Freq: Once | INTRAMUSCULAR | Status: AC
  Administered 2016-08-21: 1 mg via INTRAVENOUS
  Filled 2016-08-21: qty 1

## 2016-08-21 NOTE — ED Notes (Signed)
Patient transported to CT 

## 2016-08-21 NOTE — ED Notes (Signed)
Pt reports indigestion every morning for the past month.  Pt states he has tried OTC tums/acid reducers w/o relief.  Pt also states that he has had the chills intermittently as well as severe fatigue.  Pt states, "I cannot walk to the door without getting winded."

## 2016-08-21 NOTE — Discharge Instructions (Signed)
Abdominal (belly) pain can be caused by many things. Your caregiver performed an examination and possibly ordered blood/urine tests and imaging (CT scan, x-rays, ultrasound). Many cases can be observed and treated at home after initial evaluation in the emergency department. Even though you are being discharged home, abdominal pain can be unpredictable. Therefore, you need a repeated exam if your pain does not resolve, returns, or worsens. Most patients with abdominal pain don't have to be admitted to the hospital or have surgery, but serious problems like appendicitis and gallbladder attacks can start out as nonspecific pain. Many abdominal conditions cannot be diagnosed in one visit, so follow-up evaluations are very important. SEEK IMMEDIATE MEDICAL ATTENTION IF: The pain does not go away or becomes severe.  A temperature above 101 develops.  Repeated vomiting occurs (multiple episodes).  The pain becomes localized to portions of the abdomen. The right side could possibly be appendicitis. In an adult, the left lower portion of the abdomen could be colitis or diverticulitis.  Blood is being passed in stools or vomit (bright red or black tarry stools).  Return also if you develop chest pain, difficulty breathing, dizziness or fainting, or become confused, poorly responsive, or inconsolable (young children).  Free HIV and STD Testing These locations offer FREE confidential testing for HIV, Chlamydia, Gonorrhea, and Syphilis. Non-Traditional Testing Sites Address Telephone  [facility] [address], [place] [phone] Mondays 5pm - 7pm  [facility] [address] [place] [phone] Wednesdays 2pm-8pm  [facility] [address], [place] [phone] Thursdays 9am-12noon 1pm-4pm  [facility] [address],  [place] [phone] Tuesdays Thursdays 9am-12noon 1pm-4pm  [facility] offers free, confidential testing and treatment for HIV, Chlamydia, Gonorrhea, Syphilis, Herpes, Bacterial Vaginosis, Yeast, and Trichomoniasis. Traditional Testing   [facility] [address], [place] [phone]  Monday thru Friday  Call for an appointment  [facility] [address] [place] [phone] Monday thru Friday  Call for anappointment.  If you have any questions about this information please call [phone]. 01/15/2011

## 2016-08-21 NOTE — ED Triage Notes (Signed)
Pt arrives with c/o abdominal pain that has been intermittent x 54month and then became severe today. Pt endorses n/v and severe LUQ pain.

## 2016-08-21 NOTE — ED Provider Notes (Signed)
[facility]-EMERGENCY DEPT Provider Note   CSN: [id number] Arrival date & time: 08/21/16  1739     History   Chief Complaint Chief Complaint  Patient presents with  . Abdominal Pain    HPI Roy Lloyd is a 26 y.o. male with past medical history of Hirschsprung disease, seizures, who presents emergency Department with chief complaint of abdominal pain. The patient has had persistent and ongoing abdominal pain for the past month with associated nausea and vomiting. He states that he has barely been able to keep down any food or fluids. He complains that he has been losing weight. He states that he has been able to hold down his seizure medications. Today, he became tremulous. He feels associated chills. He had an episode of vomiting "pure blood" earlier today. He states that he feels like he has "severe acid going up and down his esophagus and complains that the pain is spreading along his left upper quadrant. He also feels as if he is going to pass out every time he stands up. He denies urinary symptoms, diarrhea, constipation.  HPI  Past Medical History:  Diagnosis Date  . Seizures (HCC)     There are no active problems to display for this patient.   Past Surgical History:  Procedure Laterality Date  . COLON SURGERY    . 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 Authorizing Provider  azithromycin (ZITHROMAX) 250 MG tablet Take 1 tablet (250 mg total) by mouth daily. Take first 2 tablets together, then 1 every day until finished. 01/28/15   Muthersbaugh, Dahlia ClientHannah, PA-C  ibuprofen (ADVIL,MOTRIN) 600 MG tablet Take 1 tablet (600 mg total) by mouth every 8 (eight) hours as needed. 06/25/16   Azalia Bilisampos, Kevin, MD  lacosamide (VIMPAT) 50 MG TABS Take 50-100 mg by mouth 2 (two) times daily. 50 mg in the morning and 100 mg at bedtime.    [provider]    levETIRAcetam (KEPPRA) 500 MG tablet Take 500 mg by mouth See admin instructions. 500 mg in the morning and 1000 mg in the evening.    [provider]  OVER THE COUNTER MEDICATION Take 1 Package by mouth daily as needed (FOR WORKING OUT).    [provider]  Protein POWD Take 1 scoop by mouth daily as needed (FOR WORKING OUT).    [provider]    Family History History reviewed. No pertinent family history.  Social History Social History  Substance Use Topics  . Smoking status: Current Every Day Smoker    Packs/day: 0.50    Types: Cigarettes  . Smokeless tobacco: Never Used  . Alcohol use 0.6 oz/week    1 Cans of beer per week     Allergies   Patient has no known allergies.   Review of Systems Review of Systems Ten systems reviewed and are negative for acute change, except as noted in the HPI.    Physical Exam Updated Vital Signs BP (!) 156/91   Pulse (!) 101   Temp 97.9 F (36.6 C) (Oral)   Resp (!) 23   Ht 5\' 10"  (1.778 m)   Wt 90.7 kg (200 lb)   SpO2 99%   BMI 28.70 kg/m   Physical Exam  Constitutional: He appears well-developed and well-nourished. No distress.  HENT:  Head: Normocephalic and atraumatic.  Eyes:  Conjunctivae are normal. No scleral icterus.  Neck: Normal range of motion. Neck supple.  Cardiovascular: Normal rate, regular rhythm and normal heart sounds.   Pulmonary/Chest: Effort normal and breath sounds normal. No respiratory distress.  Abdominal: Soft. He exhibits no distension. There is tenderness. There is guarding.  Musculoskeletal: He exhibits no edema.  Neurological: He is alert.  Skin: Skin is warm and dry. He is not diaphoretic.  Psychiatric: His behavior is normal.  Nursing note and vitals reviewed.    ED Treatments / Results  Labs (all labs ordered are listed, but only abnormal results are displayed) Labs Reviewed  LIPASE, BLOOD  COMPREHENSIVE METABOLIC PANEL  CBC  URINALYSIS, ROUTINE W REFLEX  MICROSCOPIC    EKG  EKG Interpretation None       Radiology No results found.  Procedures Procedures (including critical care time)  Medications Ordered in ED Medications  ondansetron (ZOFRAN-ODT) 4 MG disintegrating tablet (not administered)  oxyCODONE-acetaminophen (PERCOCET/ROXICET) 5-325 MG per tablet (not administered)     Initial Impression / Assessment and Plan / ED Course  I have reviewed the triage vital signs and the nursing notes.  Pertinent labs & imaging results that were available during my care of the patient were reviewed by me and considered in my medical decision making (see chart for details).     Patient workup negative for acute abnormality. He appears improved after pain medication and antianxiety medicine. His urine does appear contaminated and he also appears to be dehydrated. I discussed the possibility of cannabis induced hyperemesis. I have asked the patient to consider discontinuing use of marijuana as this may be contributing to his vomiting. I also have strong for consideration for potential ulcer and will treat the patient as such. He is advised to follow-up with gastroenterology. Tolerating by mouth fluids. Patient safe for discharge at this time.  Final Clinical Impressions(s) / ED Diagnoses   Final diagnoses:  Epigastric pain    New Prescriptions New Prescriptions   No medications on file     Arthor Captain, Cordelia Poche 08/26/16 1716    Mesner, Barbara Cower, MD 08/26/16 1859

## 2016-11-10 ENCOUNTER — Encounter: Payer: Self-pay | Admitting: Neurology

## 2016-11-13 ENCOUNTER — Encounter: Payer: Self-pay | Admitting: Neurology

## 2016-11-13 ENCOUNTER — Ambulatory Visit (INDEPENDENT_AMBULATORY_CARE_PROVIDER_SITE_OTHER): Payer: BLUE CROSS/BLUE SHIELD | Admitting: Neurology

## 2016-11-13 VITALS — BP 124/82 | HR 87 | Wt 217.0 lb

## 2016-11-13 DIAGNOSIS — G40019 Localization-related (focal) (partial) idiopathic epilepsy and epileptic syndromes with seizures of localized onset, intractable, without status epilepticus: Secondary | ICD-10-CM

## 2016-11-13 MED ORDER — ZONISAMIDE 100 MG PO CAPS: ORAL_CAPSULE | ORAL | 6 refills | Status: DC
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
## 2016-11-16 ENCOUNTER — Telehealth: Payer: Self-pay | Admitting: Neurology

## 2016-11-16 NOTE — Telephone Encounter (Signed)
Pt's mother Albin FellingCarla called and left a voicemail message asking for a call back in regards to some questions she had about his visit

## 2016-11-17 DIAGNOSIS — G40019 Localization-related (focal) (partial) idiopathic epilepsy and epileptic syndromes with seizures of localized onset, intractable, without status epilepticus: Secondary | ICD-10-CM | POA: Insufficient documentation

## 2016-11-17 NOTE — Telephone Encounter (Signed)
LMOM asking pt's mother to return my call.

## 2016-11-23 ENCOUNTER — Telehealth: Payer: Self-pay | Admitting: Neurology

## 2016-11-23 ENCOUNTER — Ambulatory Visit (INDEPENDENT_AMBULATORY_CARE_PROVIDER_SITE_OTHER): Payer: BLUE CROSS/BLUE SHIELD | Admitting: Neurology

## 2016-11-23 DIAGNOSIS — G40019 Localization-related (focal) (partial) idiopathic epilepsy and epileptic syndromes with seizures of localized onset, intractable, without status epilepticus: Secondary | ICD-10-CM | POA: Diagnosis not present

## 2016-11-23 NOTE — Telephone Encounter (Signed)
Handwritten note states:  "I'm a widow.  Bobby + 1 other son live with me.  Didn't want to say anything in front of my son + make him feel bad - but can you let the doctor know - I'm very worried about my son's memory loss.  It's been getting worse lately.  I'm very concerned.                                                              Roy Lloyd"

## 2016-11-23 NOTE — Telephone Encounter (Signed)
Pt's mother called and wanted to let Dr Karel Jarvis know that she observed her son having a seizure in his sleep over the weekend/Dawn

## 2016-11-23 NOTE — Telephone Encounter (Signed)
Patient mother came with patient to appt for EGG today and let me know that she is very concerned about patient memory loss. She did not want to say anything in front of the patient so she wrote it down on a note and handed it to me as he was getting the EGG. She wanted this information added to his chart if you have any questions you may call the mother at [phone]

## 2016-11-24 DIAGNOSIS — G40019 Localization-related (focal) (partial) idiopathic epilepsy and epileptic syndromes with seizures of localized onset, intractable, without status epilepticus: Secondary | ICD-10-CM | POA: Diagnosis not present

## 2016-11-24 NOTE — Telephone Encounter (Signed)
Noted  

## 2016-11-25 DIAGNOSIS — G40019 Localization-related (focal) (partial) idiopathic epilepsy and epileptic syndromes with seizures of localized onset, intractable, without status epilepticus: Secondary | ICD-10-CM | POA: Diagnosis not present

## 2016-11-26 ENCOUNTER — Telehealth: Payer: Self-pay | Admitting: Neurology

## 2016-11-26 NOTE — Telephone Encounter (Signed)
Noted.  Mother has let us know this information several times (although her being adopted is new news)    48 Hr EEG removed this morning, told pt, his mother and his brother that it will be about 2-3 weeks before I call them with the results.  They all expressed understanding.

## 2016-11-26 NOTE — Telephone Encounter (Signed)
Mother (carla) called regarding Kuz[place]. She said she is very concerned about her son. He has had more Seizures this year than last. Also, that he will forget he told her something and repeat it. She said he forgets a lot. She also said she is adopted and she does not know what is in her blood line but she is fearful of his situation. Please Advise. Thanks

## 2016-11-27 ENCOUNTER — Telehealth: Payer: Self-pay | Admitting: Neurology

## 2016-11-27 NOTE — Telephone Encounter (Signed)
Pt's mother called and wants to know if Dr Karel Jarvis can write a small letter regarding pt because he is trying to get disablilty

## 2016-11-27 NOTE — Telephone Encounter (Signed)
Dr Karel Jarvis out of the office today.  Routing for her to see on Monday when she returns.

## 2016-11-30 ENCOUNTER — Telehealth: Payer: Self-pay | Admitting: Neurology

## 2016-11-30 NOTE — Telephone Encounter (Deleted)
I told the pt, his mother and his brother on 9/20

## 2016-11-30 NOTE — Telephone Encounter (Signed)
I told the pt, his mother and brother on 9/20 that it could be 2-3 before results are ready.  Ala Bent also confirmed this.  I can write a letter, if OK'd by Dr. Karel Jarvis.  Unsure of what to write though...Marland KitchenMarland Kitchen

## 2016-11-30 NOTE — Telephone Encounter (Signed)
Mother called regarding her call last week about her son's eeg results and a letter for his disability claim. She said she is needing documentation regarding his memory. Please Advise. Thanks

## 2016-12-02 ENCOUNTER — Encounter: Payer: Self-pay | Admitting: Neurology

## 2016-12-02 NOTE — Telephone Encounter (Signed)
Pls let mother know the note is ready. Also, please let patient and mother know that the EEG is normal. The medications can normalize the EEG, it is not like a pregnancy test that is positive or negative. Continue with current medications as discussed. Proceed with MRI brain (when is it scheduled)? Also, pls let them know I would like to have him do formal memory testing in our office, pls order Neurocognitive testing with Dr. Alinda Dooms. Thanks

## 2016-12-02 NOTE — Procedures (Signed)
ELECTROENCEPHALOGRAM REPORT  Dates of Recording: 11/23/2016 1:50PM to 11/26/2016 7:38AM  Patient's Name: [name] MRN: [id number] Date of Birth: 06/26/1990  Referring Provider: Dr. Patrcia Dolly  Procedure: 72-hour ambulatory EEG  History: This is a 26 year old man with recurrent seizures with behavioral arrest, staring, then convulsions. He is also reporting memory loss. EEG for seizure classification and to assess for subclinical seizures affecting memory.  Medications: Vimpat, Dilantin, Zonisamide  Technical Summary: This is a 72-hour multichannel digital EEG recording measured by the international 10-20 system with electrodes applied with paste and impedances below 5000 ohms performed as portable with EKG monitoring.  The digital EEG was referentially recorded, reformatted, and digitally filtered in a variety of bipolar and referential montages for optimal display.    DESCRIPTION OF RECORDING: During maximal wakefulness, the background activity consisted of a symmetric 8.5-9 Hz posterior dominant rhythm which was reactive to eye opening.  There were no epileptiform discharges or focal slowing seen in wakefulness.  During the recording, the patient progresses through wakefulness, drowsiness, and Stage 2 sleep.  There were rare sharp transients in a generalized fashion seen in sleep that appeared artifactual. There were no clear epileptiform discharges seen.  Events: There were no push button events.   There were no electrographic seizures seen.  EKG lead was unremarkable.  IMPRESSION: This 72-hour ambulatory EEG study is normal.    CLINICAL CORRELATION: A normal EEG does not exclude a clinical diagnosis of epilepsy. Typical events were not captured. There were no subclinical seizures seen. If further clinical questions remain, inpatient video EEG monitoring may be helpful.   Patrcia Dolly, M.D.

## 2016-12-02 NOTE — Telephone Encounter (Signed)
Patient's mother calling back regarding her son Roy Lloyd. She is needing Documentation regarding records for his social security  Disability. They are needing to make a decision? Please Advise. Thanks

## 2016-12-02 NOTE — Telephone Encounter (Signed)
See other note

## 2016-12-12 ENCOUNTER — Ambulatory Visit
Admission: RE | Admit: 2016-12-12 | Discharge: 2016-12-12 | Disposition: A | Discharge status: Home / Self Care | Payer: BLUE CROSS/BLUE SHIELD | Source: Ambulatory Visit | Attending: Neurology | Admitting: Neurology

## 2016-12-12 DIAGNOSIS — G40019 Localization-related (focal) (partial) idiopathic epilepsy and epileptic syndromes with seizures of localized onset, intractable, without status epilepticus: Secondary | ICD-10-CM

## 2016-12-12 MED ORDER — GADOBENATE DIMEGLUMINE 529 MG/ML IV SOLN
20.0000 mL | Freq: Once | INTRAVENOUS | Status: AC | PRN
  Administered 2016-12-12: 20 mL via INTRAVENOUS

## 2016-12-14 ENCOUNTER — Telehealth: Payer: Self-pay | Admitting: Neurology

## 2016-12-14 NOTE — Telephone Encounter (Signed)
-----   Message from Van Clines, MD sent at 12/14/2016 10:44 AM EDT ----- Pls let him know MRI brain did not show any evidence of tumor, stroke, or bleed, it was normal. The MRI brain shows [place] the structure of the brain, it does not show [place] the electricity in the brain. Continue with medications as discussed. Thanks

## 2016-12-14 NOTE — Telephone Encounter (Signed)
LMOM relaying message below.  

## 2016-12-14 NOTE — Telephone Encounter (Signed)
Pt called and wanted to know if his MRI results were available yet

## 2016-12-30 ENCOUNTER — Telehealth: Payer: Self-pay | Admitting: Neurology

## 2016-12-30 ENCOUNTER — Ambulatory Visit: Payer: BLUE CROSS/BLUE SHIELD | Admitting: Neurology

## 2016-12-30 NOTE — Telephone Encounter (Signed)
Pt's mother called and said pt had another seizure and wanted Dr Karel JarvisAquino to be aware of that

## 2017-01-07 ENCOUNTER — Telehealth: Payer: Self-pay | Admitting: Neurology

## 2017-01-07 NOTE — Telephone Encounter (Signed)
Spoke to Roy MaduroRobert asking how he is doing, his mother called last week that he had a seizure on 12/29/16 in his sleep. It is unclear if he is taking or not taking medication, states he started the zonisamide when it was prescribed last Sept, but then said he was not taking it around the time of the last seizure. He says it was his fault and he did not know his mother had called. I told him we should be aware when he has seizures so we can adjust medications if needed. He ran out of Vimpat around the time of the last seizure as well, but now has the prescription. He tapered off Dilantin a month ago. Discussed continuing Zonisamide 300mg  qhs and restarting Vimpat as prescribed. We again discussed [place] driving laws that he should not drive until 6 months seizure-free. He will be scheduled for an earlier f/u.

## 2017-02-01 ENCOUNTER — Emergency Department (HOSPITAL_COMMUNITY)
Admission: EM | Admit: 2017-02-01 | Discharge: 2017-02-01 | Disposition: A | Discharge status: Home / Self Care | Payer: BLUE CROSS/BLUE SHIELD | Attending: Physician Assistant | Admitting: Physician Assistant

## 2017-02-01 ENCOUNTER — Encounter (HOSPITAL_COMMUNITY): Payer: Self-pay | Admitting: Emergency Medicine

## 2017-02-01 ENCOUNTER — Emergency Department (HOSPITAL_COMMUNITY): Payer: BLUE CROSS/BLUE SHIELD

## 2017-02-01 ENCOUNTER — Other Ambulatory Visit: Payer: Self-pay

## 2017-02-01 DIAGNOSIS — F17210 Nicotine dependence, cigarettes, uncomplicated: Secondary | ICD-10-CM | POA: Insufficient documentation

## 2017-02-01 DIAGNOSIS — R109 Unspecified abdominal pain: Secondary | ICD-10-CM | POA: Diagnosis present

## 2017-02-01 DIAGNOSIS — Z79899 Other long term (current) drug therapy: Secondary | ICD-10-CM | POA: Diagnosis not present

## 2017-02-01 DIAGNOSIS — R1084 Generalized abdominal pain: Secondary | ICD-10-CM

## 2017-02-01 DIAGNOSIS — K529 Noninfective gastroenteritis and colitis, unspecified: Secondary | ICD-10-CM | POA: Diagnosis not present

## 2017-02-01 LAB — URINALYSIS, ROUTINE W REFLEX MICROSCOPIC
BILIRUBIN URINE: NEGATIVE
Glucose, UA: NEGATIVE mg/dL
HGB URINE DIPSTICK: NEGATIVE
Ketones, ur: NEGATIVE mg/dL
Leukocytes, UA: NEGATIVE
Nitrite: NEGATIVE
PH: 6 (ref 5.0–8.0)
Protein, ur: NEGATIVE mg/dL
SPECIFIC GRAVITY, URINE: 1.025 (ref 1.005–1.030)

## 2017-02-01 LAB — CBC
HEMATOCRIT: 43.8 % (ref 39.0–52.0)
HEMOGLOBIN: 15.4 g/dL (ref 13.0–17.0)
MCH: 32 pg (ref 26.0–34.0)
MCHC: 35.2 g/dL (ref 30.0–36.0)
MCV: 91.1 fL (ref 78.0–100.0)
Platelets: 255 10*3/uL (ref 150–400)
RBC: 4.81 MIL/uL (ref 4.22–5.81)
RDW: 13 % (ref 11.5–15.5)
WBC: 8.8 10*3/uL (ref 4.0–10.5)

## 2017-02-01 LAB — RAPID URINE DRUG SCREEN, HOSP PERFORMED
AMPHETAMINES: NOT DETECTED
BARBITURATES: NOT DETECTED
Benzodiazepines: NOT DETECTED
Cocaine: NOT DETECTED
OPIATES: NOT DETECTED
TETRAHYDROCANNABINOL: POSITIVE — AB

## 2017-02-01 LAB — COMPREHENSIVE METABOLIC PANEL
ALBUMIN: 4.3 g/dL (ref 3.5–5.0)
ALK PHOS: 67 U/L (ref 38–126)
ALT: 22 U/L (ref 17–63)
ANION GAP: 7 (ref 5–15)
AST: 22 U/L (ref 15–41)
BUN: 11 mg/dL (ref 6–20)
CHLORIDE: 109 mmol/L (ref 101–111)
CO2: 22 mmol/L (ref 22–32)
Calcium: 9.4 mg/dL (ref 8.9–10.3)
Creatinine, Ser: 0.97 mg/dL (ref 0.61–1.24)
GFR calc non Af Amer: >60 mL/min (ref >60)
Glucose, Bld: 96 mg/dL (ref 65–99)
POTASSIUM: 3.9 mmol/L (ref 3.5–5.1)
SODIUM: 138 mmol/L (ref 135–145)
Total Bilirubin: 0.5 mg/dL (ref 0.3–1.2)
Total Protein: 7 g/dL (ref 6.5–8.1)

## 2017-02-01 LAB — LIPASE, BLOOD: LIPASE: 28 U/L (ref 11–51)

## 2017-02-01 MED ORDER — MORPHINE SULFATE (PF) 4 MG/ML IV SOLN
4.0000 mg | Freq: Once | INTRAVENOUS | Status: AC
  Administered 2017-02-01: 4 mg via INTRAVENOUS
  Filled 2017-02-01: qty 1

## 2017-02-01 MED ORDER — DICYCLOMINE HCL 20 MG PO TABS: 20.0000 mg | ORAL_TABLET | Freq: Two times a day (BID) | ORAL | 0 refills | Status: DC

## 2017-02-01 MED ORDER — IOPAMIDOL (ISOVUE-300) INJECTION 61%
INTRAVENOUS | Status: AC
  Administered 2017-02-01: 100 mL
  Filled 2017-02-01: qty 100

## 2017-02-01 MED ORDER — METOCLOPRAMIDE HCL 10 MG PO TABS: 10.0000 mg | ORAL_TABLET | Freq: Four times a day (QID) | ORAL | 0 refills | Status: DC

## 2017-02-01 MED ORDER — ONDANSETRON HCL 4 MG/2ML IJ SOLN
4.0000 mg | Freq: Once | INTRAMUSCULAR | Status: AC
  Administered 2017-02-01: 4 mg via INTRAVENOUS
  Filled 2017-02-01: qty 2

## 2017-02-01 MED ORDER — ONDANSETRON 4 MG PO TBDP: 4.0000 mg | ORAL_TABLET | Freq: Once | ORAL | Status: DC | PRN

## 2017-02-01 MED ORDER — SODIUM CHLORIDE 0.9 % IV BOLUS (SEPSIS)
1000.0000 mL | Freq: Once | INTRAVENOUS | Status: AC
  Administered 2017-02-01: 1000 mL via INTRAVENOUS

## 2017-02-01 NOTE — ED Notes (Signed)
The doors opened to the waiting room opened for this RN to walk a patient out from triage when the patient was noted to be on the floor with his mother. Pt Stated, "My stomach hurts and I can't sit up. What have y'all been doing back there?" Pt helped out of the floor. His nose noted to be bleeding. Pt alert and oriented. Angry at this time. Mother at the bedside

## 2017-02-01 NOTE — ED Notes (Signed)
Patient transported to CT 

## 2017-02-01 NOTE — ED Notes (Signed)
Was standing at registration to pick up bag and registration stated a "A lady stated her son is going to pass out".   As she was attempting to point out the patient I noted a young male stand up out of his wheel chair.  A woman assisted the pt to a chair. From which he leaned over and fell slowly onto the floor.

## 2017-02-01 NOTE — ED Notes (Signed)
Swallow test done; pt tolerated clear liquids without issues.

## 2017-02-01 NOTE — ED Notes (Signed)
Pt up walking around triage without any trouble. Went to double doors and back to room not seeming to be in any distress.  Pt family member is getting pt something to drink. We advise family member that pt can not drink.

## 2017-02-01 NOTE — ED Notes (Signed)
Pt unable to void at this time. 

## 2017-02-01 NOTE — ED Notes (Signed)
Pt ambulated to restroom with family member, reports pt had unmeasured episode of diarrhea.  This RN unable to collect stool specimen.

## 2017-02-01 NOTE — ED Provider Notes (Signed)
[facility] Provider Note   CSN: [id number] Arrival date & time: 02/01/17  [id number]     History   Chief Complaint Chief Complaint  Patient presents with  . Abdominal Pain    HPI Roy Lloyd is a 26 y.o. male.  HPI  Roy Lloyd is a 26 y.o. male, with a history of seizures and Hirschsprung disease, presenting to the ED with abdominal pain and diarrhea beginning yesterday.  Endorses intermittent constipation and loose stools that he describes as "greasy."  Endorses nausea as well as belching that smells of fecal matter.  Pain is cramping, generalized abdomen, 10/10, nonradiating.  Patient states, "I think it might be Giardia.  I got 2 new puppies 2 weeks ago and they both have diarrhea.  When I looked on the Internet the symptoms fit with Giardia."  Denies vomiting, fever, urinary symptoms, hematochezia, or any other complaints.    Past Medical History:  Diagnosis Date  . Seizures [facility](HCC)     Patient Active Problem List   Diagnosis Date Noted  . Localization-related (focal) (partial) idiopathic epilepsy and epileptic syndromes with seizures of localized onset, intractable, without status epilepticus (HCC) 11/17/2016    Past Surgical History:  Procedure Laterality Date  . COLON SURGERY    . 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 Authorizing Provider  hydrOXYzine (ATARAX/VISTARIL) 25 MG tablet Take 25 mg by mouth 3 (three) times daily as needed. 12/24/16  Yes [provider]  lacosamide (VIMPAT) 50 MG TABS Take 50-100 mg by mouth 2 (two) times daily. 100 mg in the morning and 100 mg at bedtime.   Yes [provider]  phenytoin (DILANTIN) 100 MG ER capsule TAKE 4 CAPSULES BY MOUTH EVERY EVENING 10/21/16  Yes [provider]  valACYclovir (VALTREX) 1000 MG tablet Take 1,000 mg  by mouth daily. 01/17/17  Yes [provider]  zonisamide (ZONEGRAN) 100 MG capsule Take 1 capsule at night for 2 weeks, then increase to 2 capsules at night for 2 weeks, then increase to 3 capsules at night 11/13/16  Yes Van ClinesAquino, Karen M, MD  dicyclomine (BENTYL) 20 MG tablet Take 1 tablet (20 mg total) by mouth 2 (two) times daily. 02/01/17   Joy, Shawn C, PA-C  metoCLOPramide (REGLAN) 10 MG tablet Take 1 tablet (10 mg total) by mouth every 6 (six) hours. 02/01/17   Joy, Shawn C, PA-C  sucralfate (CARAFATE) 1 g tablet Take 1 tablet (1 g total) by mouth 4 (four) times daily -  with meals and at bedtime. Patient not taking: Reported on 11/13/2016 08/21/16   Arthor CaptainHarris, Abigail, PA-C    Family History No family history on file.  Social History Social History   Tobacco Use  . Smoking status: Current Every Day Smoker    Packs/day: 0.50    Types: Cigarettes  . Smokeless tobacco: Never Used  Substance Use Topics  . Alcohol use: Yes    Alcohol/week: 0.6 oz    Types: 1 Cans of beer per week  . Drug use: Yes    Types: Marijuana     Allergies   Patient has no known allergies.   Review of Systems Review of Systems  Constitutional: Negative for chills, diaphoresis and fever.  Respiratory: Negative for shortness of breath.   Cardiovascular: Negative for chest pain.  Gastrointestinal: Positive for abdominal pain, diarrhea and nausea. Negative for vomiting.  Genitourinary: Negative for dysuria, flank pain and hematuria.  Neurological: Negative for dizziness and syncope.  All other systems reviewed and are negative.    Physical Exam Updated Vital Signs BP (!) 137/98 (BP Location: Right Arm)   Pulse 69   Temp 97.8 F (36.6 C) (Oral)   Resp 18   Ht 5\' 10"  (1.778 m)   Wt 95.3 kg (210 lb)   SpO2 100%   BMI 30.13 kg/m   Physical Exam  Constitutional: He appears well-developed and well-nourished. No distress.  HENT:  Head: Normocephalic and atraumatic.  Mouth/Throat: Oropharynx  is clear and moist.  Eyes: Conjunctivae are normal.  Neck: Neck supple.  Cardiovascular: Normal rate, regular rhythm, normal heart sounds and intact distal pulses.  Pulmonary/Chest: Effort normal and breath sounds normal. No respiratory distress.  Abdominal: Soft. Bowel sounds are increased. There is generalized tenderness. There is guarding.  Musculoskeletal: He exhibits no edema.  Lymphadenopathy:    He has no cervical adenopathy.  Neurological: He is alert.  Skin: Skin is warm and dry. He is not diaphoretic.  Psychiatric: He has a normal mood and affect. His behavior is normal.  Nursing note and vitals reviewed.    ED Treatments / Results  Labs (all labs ordered are listed, but only abnormal results are displayed) Labs Reviewed  RAPID URINE DRUG SCREEN, HOSP PERFORMED - Abnormal; Notable for the following components:      Result Value   Tetrahydrocannabinol POSITIVE (*)    All other components within normal limits  GASTROINTESTINAL PANEL BY PCR, STOOL (REPLACES STOOL CULTURE)  LIPASE, BLOOD  COMPREHENSIVE METABOLIC PANEL  CBC  URINALYSIS, ROUTINE W REFLEX MICROSCOPIC    EKG  EKG Interpretation None       Radiology Ct Abdomen Pelvis W Contrast  Result Date: 02/01/2017 CLINICAL DATA:  Lower abdominal pain beginning this morning with nausea and vomiting. Diarrhea. History of colon surgery for Hirschsprung disease. EXAM: CT ABDOMEN AND PELVIS WITH CONTRAST TECHNIQUE: Multidetector CT imaging of the abdomen and pelvis was performed using the standard protocol following bolus administration of intravenous contrast. CONTRAST:  ISOVUE-300 IOPAMIDOL (ISOVUE-300) INJECTION 61% COMPARISON:  08/21/2016 FINDINGS: Lower chest: Clear lung bases. Hepatobiliary: Decreased attenuation of the liver may indicate steatosis, though evaluation is limited on this contrast-enhanced examination. No focal liver abnormality is seen. The gallbladder is unremarkable. There is no biliary  dilatation. Pancreas: Unremarkable. Spleen: Unremarkable. Adrenals/Urinary Tract: Unremarkable adrenal glands. No evidence of renal mass, calculi, or hydronephrosis. Unremarkable bladder. Stomach/Bowel: The stomach is within normal limits. Fluid is present in the colon and in multiple small bowel loops. No bowel dilatation suggestive of obstruction is identified. There is mucosal hyperenhancement of several small bowel loops in the lower abdomen, predominantly left lower quadrant. The appendix is not clearly identified, however no inflammatory changes are seen in the right lower quadrant to indicate acute appendicitis. Vascular/Lymphatic: No significant vascular findings are present. No enlarged abdominal or pelvic lymph nodes. Reproductive: Unremarkable prostate. Other: No intraperitoneal free fluid. No abdominal wall mass or hernia. 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 IMPRESSION: Fluid-filled loops of distal small bowel and colon with prominent mucosal enhancement of some small bowel loops suggesting enteritis. No bowel obstruction. Electronically Signed   By: Sebastian Ache M.D.   On: 02/01/2017 11:05   Dg Abdomen Acute W/chest  Result Date: 02/01/2017 CLINICAL DATA:  Generalized abdominal pain since last night.  Vomiting. Diarrhea. EXAM: DG ABDOMEN ACUTE W/ 1V CHEST COMPARISON:  CT abdomen and pelvis 08/21/2016 FINDINGS: Normal heart size and pulmonary vascularity. No focal airspace disease or consolidation in the lungs. No blunting of costophrenic angles. No pneumothorax. Mediastinal contours appear intact. Scattered gas and stool in the colon. Scattered gas in the small bowel. No small or large bowel distention. No free intra-abdominal air. No abnormal air-fluid levels. No radiopaque stones. Visualized bones appear intact. IMPRESSION: No evidence of active pulmonary disease. Nonobstructive bowel gas pattern. Electronically Signed   By: Burman NievesWilliam  Stevens  M.D.   On: 02/01/2017 05:58    Procedures Procedures (including critical care time)  Medications Ordered in ED Medications  morphine 4 MG/ML injection 4 mg (4 mg Intravenous Given 02/01/17 0901)  ondansetron (ZOFRAN) injection 4 mg (4 mg Intravenous Given 02/01/17 0901)  sodium chloride 0.9 % bolus 1,000 mL (0 mLs Intravenous Stopped 02/01/17 1011)  iopamidol (ISOVUE-300) 61 % injection (100 mLs  Contrast Given 02/01/17 1040)  morphine 4 MG/ML injection 4 mg (4 mg Intravenous Given 02/01/17 1059)     Initial Impression / Assessment and Plan / ED Course  I have reviewed the triage vital signs and the nursing notes.  Pertinent labs & imaging results that were available during my care of the patient were reviewed by me and considered in my medical decision making (see chart for details).  Clinical Course as of Feb 02 1216  Mon Feb 01, 2017  [id number] Discussed lab results with the patient.  Patient states his pain has improved to 5/10. He is resting comfortably in the bed.  [SJ]  1150 Patient sleeping upon reevaluation.  Discussed imaging results.  Patient voiced understanding and agrees with the plan.  [SJ]    Clinical Course User Index [SJ] Joy, Shawn C, PA-C    Patient presents with abdominal pain and diarrhea.  Patient unable to provide stool sample during ED course.  Patient is nontoxic appearing, afebrile, not tachycardic, not tachypneic, not hypotensive, maintains SPO2 of 100% on room air, and is in no apparent distress. Evidence of possible enteritis on CT.  I do not think patient fits criteria for antibiotic therapy at this time. Abdominal exam benign at discharge.  Able to tolerate PO. PCP vs GI follow up. The patient was given instructions for home care as well as return precautions. Patient voices understanding of these instructions, accepts the plan, and is comfortable with discharge.  Findings and plan of care discussed with Bary Castillaourteney Mackuen, MD.   Vitals:   02/01/17 0528  02/01/17 0529 02/01/17 1158  BP: (!) 137/98  138/90  Pulse: 69  (!) 43  Resp: 18  16  Temp: 97.8 F (36.6 C)  98.2 F (36.8 C)  TempSrc: Oral  Oral  SpO2: 100%  100%  Weight:  95.3 kg (210 lb)   Height:  5\' 10"  (1.778 m)      Final Clinical Impressions(s) / ED Diagnoses   Final diagnoses:  Generalized abdominal pain  Enteritis    ED Discharge Orders        Ordered    dicyclomine (BENTYL) 20 MG tablet  2 times daily     02/01/17 1205    metoCLOPramide (REGLAN) 10 MG tablet  Every 6 hours     02/01/17 1205       Anselm PancoastJoy, Shawn C, PA-C 02/01/17 1218    Anselm PancoastJoy, Shawn C, PA-C 02/01/17 1219    Abelino DerrickMackuen, Courteney Lyn, MD 02/10/17 0800

## 2017-02-01 NOTE — Discharge Instructions (Signed)
Your lab results were reassuring.  There were signs of some possible enteritis on the CT scan. Bentyl for abdominal discomfort.  Reglan for nausea and diarrhea.  Be sure to drink plenty of water to stay well hydrated.  Start with a clear liquid diet and then a full liquid diet and then a bland solid diet as your symptoms allow. You may take a stool sample to your PCP or the GI specialist.

## 2017-02-01 NOTE — ED Notes (Signed)
Pt discharged from ED; instructions provided and scripts given; Pt encouraged to return to ED if symptoms worsen and to f/u with PCP; Pt verbalized understanding of all instructions 

## 2017-02-01 NOTE — ED Triage Notes (Signed)
Pt c/o generalized abd pain onset last night, +emesis x 2, diarrhea multiple times. Pt reports have foul smelling belching.

## 2017-02-19 ENCOUNTER — Telehealth: Payer: Self-pay | Admitting: Neurology

## 2017-02-19 NOTE — Telephone Encounter (Signed)
Spoke with pt.  He sounded as though I had woken him up.  He states that he thinks his seizure was yesterday, as he can't remember much about yesterday - states this is usual for him post seizure. I asked about medications, he states that he has a pill box now with AM/PM - and he took all of his medications.  Says that he was not able to sleep long last night - more so had many short periods of dozing off.  States that he thinks the medications are not working.  I advised that I would call him if Dr. Rosalyn GessAquino's had any recommendations.

## 2017-02-19 NOTE — Telephone Encounter (Signed)
Returned call.  All phone numbers listed for pt are his mother's cell phone.  Mother gave me pt's cell number and asked that I waited about 5 minutes before calling - she would like to call him and let him know that I am calling as he never answers unknown numbers.

## 2017-02-19 NOTE — Telephone Encounter (Signed)
Patient's mother called and wanted to let Dr. Karel JarvisAquino know that Roy MaduroRobert had a Breakthrough Seizure a few days ago. Thanks

## 2017-02-19 NOTE — Telephone Encounter (Signed)
Can you pls call Roy Lloyd and ask him if he missed any doses of his medications prior to this recent seizure? The last time he said he missed Zonisamide. Pls confirm how he is taking his medications (he is listed as taking Zonisamide 300mg  qhs and Vimpat 100mg  BID). Thanks

## 2017-02-19 NOTE — Telephone Encounter (Signed)
Pls let him know that there is still room to increase his medication, everyone is different and needs different doses of medication. Increase Zonisamide 100mg : Take 4 caps at bedtime. Continue Vimpat 100mg  BID. I had previously discussed doing an inpatient video EEG study with him and his mother. I think this would be the next best step for his continued seizures. If he is agreeable, [facility]. Thanks

## 2017-02-23 NOTE — Telephone Encounter (Signed)
LMOVM for pt relaying message below and asking for return call.

## 2017-03-07 ENCOUNTER — Other Ambulatory Visit: Payer: Self-pay

## 2017-03-07 ENCOUNTER — Emergency Department (HOSPITAL_COMMUNITY)
Admission: EM | Admit: 2017-03-07 | Discharge: 2017-03-07 | Disposition: A | Discharge status: Home / Self Care | Payer: BLUE CROSS/BLUE SHIELD | Attending: Emergency Medicine | Admitting: Emergency Medicine

## 2017-03-07 ENCOUNTER — Emergency Department (HOSPITAL_COMMUNITY): Payer: BLUE CROSS/BLUE SHIELD

## 2017-03-07 DIAGNOSIS — R1110 Vomiting, unspecified: Secondary | ICD-10-CM | POA: Insufficient documentation

## 2017-03-07 DIAGNOSIS — F17210 Nicotine dependence, cigarettes, uncomplicated: Secondary | ICD-10-CM | POA: Diagnosis not present

## 2017-03-07 DIAGNOSIS — K529 Noninfective gastroenteritis and colitis, unspecified: Secondary | ICD-10-CM | POA: Diagnosis not present

## 2017-03-07 DIAGNOSIS — R197 Diarrhea, unspecified: Secondary | ICD-10-CM | POA: Diagnosis not present

## 2017-03-07 DIAGNOSIS — R109 Unspecified abdominal pain: Secondary | ICD-10-CM | POA: Diagnosis present

## 2017-03-07 LAB — COMPREHENSIVE METABOLIC PANEL
ALT: 35 U/L (ref 17–63)
ANION GAP: 6 (ref 5–15)
AST: 27 U/L (ref 15–41)
Albumin: 4.5 g/dL (ref 3.5–5.0)
Alkaline Phosphatase: 66 U/L (ref 38–126)
BUN: 14 mg/dL (ref 6–20)
CALCIUM: 9.7 mg/dL (ref 8.9–10.3)
CHLORIDE: 111 mmol/L (ref 101–111)
CO2: 20 mmol/L — AB (ref 22–32)
Creatinine, Ser: 0.91 mg/dL (ref 0.61–1.24)
GFR calc non Af Amer: >60 mL/min (ref >60)
Glucose, Bld: 108 mg/dL — ABNORMAL HIGH (ref 65–99)
Potassium: 3.7 mmol/L (ref 3.5–5.1)
SODIUM: 137 mmol/L (ref 135–145)
Total Bilirubin: 0.9 mg/dL (ref 0.3–1.2)
Total Protein: 7.5 g/dL (ref 6.5–8.1)

## 2017-03-07 LAB — CBC
HCT: 43.3 % (ref 39.0–52.0)
HEMOGLOBIN: 15.2 g/dL (ref 13.0–17.0)
MCH: 32.5 pg (ref 26.0–34.0)
MCHC: 35.1 g/dL (ref 30.0–36.0)
MCV: 92.5 fL (ref 78.0–100.0)
Platelets: 274 10*3/uL (ref 150–400)
RBC: 4.68 MIL/uL (ref 4.22–5.81)
RDW: 13 % (ref 11.5–15.5)
WBC: 7.8 10*3/uL (ref 4.0–10.5)

## 2017-03-07 LAB — LIPASE, BLOOD: LIPASE: 24 U/L (ref 11–51)

## 2017-03-07 MED ORDER — IOPAMIDOL (ISOVUE-300) INJECTION 61%
INTRAVENOUS | Status: AC
  Filled 2017-03-07: qty 100

## 2017-03-07 MED ORDER — IOPAMIDOL (ISOVUE-300) INJECTION 61%
100.0000 mL | Freq: Once | INTRAVENOUS | Status: AC | PRN
  Administered 2017-03-07: 100 mL via INTRAVENOUS

## 2017-03-07 MED ORDER — CIPROFLOXACIN IN D5W 400 MG/200ML IV SOLN
400.0000 mg | Freq: Once | INTRAVENOUS | Status: AC
  Administered 2017-03-07: 400 mg via INTRAVENOUS
  Filled 2017-03-07: qty 200

## 2017-03-07 MED ORDER — HYDROMORPHONE HCL 1 MG/ML IJ SOLN
1.0000 mg | Freq: Once | INTRAMUSCULAR | Status: AC
  Administered 2017-03-07: 1 mg via INTRAVENOUS
  Filled 2017-03-07: qty 1

## 2017-03-07 MED ORDER — METOCLOPRAMIDE HCL 10 MG PO TABS: 10.0000 mg | ORAL_TABLET | Freq: Four times a day (QID) | ORAL | 0 refills | Status: DC

## 2017-03-07 MED ORDER — METOCLOPRAMIDE HCL 5 MG/ML IJ SOLN
10.0000 mg | Freq: Once | INTRAMUSCULAR | Status: AC
  Administered 2017-03-07: 10 mg via INTRAVENOUS
  Filled 2017-03-07: qty 2

## 2017-03-07 MED ORDER — HYDROCODONE-ACETAMINOPHEN 5-325 MG PO TABS: 1.0000 | ORAL_TABLET | Freq: Four times a day (QID) | ORAL | 0 refills | Status: DC | PRN

## 2017-03-07 MED ORDER — SODIUM CHLORIDE 0.9 % IV BOLUS (SEPSIS)
1000.0000 mL | Freq: Once | INTRAVENOUS | Status: AC
  Administered 2017-03-07: 1000 mL via INTRAVENOUS

## 2017-03-07 MED ORDER — CIPROFLOXACIN HCL 500 MG PO TABS: 500.0000 mg | ORAL_TABLET | Freq: Two times a day (BID) | ORAL | 0 refills | Status: DC

## 2017-03-07 NOTE — ED Notes (Signed)
Pt is attempting to use the bedpan with NT at bedside.  RN will attempt blood collection when pt is finished.

## 2017-03-07 NOTE — ED Notes (Signed)
Bed: WA06 Expected date:  Expected time:  Means of arrival:  Comments: Abd pain

## 2017-03-07 NOTE — ED Notes (Signed)
ED Provider at bedside. 

## 2017-03-07 NOTE — ED Triage Notes (Signed)
Per EMS:  PT reports abdominal pain Pt had an endoscopy on the 28th of Dec. Since then he has had increased diarrhea, bloating, and abdominal distension.  Last BM today Last vitals 160/82 HR 64 99% on RA.  18g in LAC. Pt has had 4 mg of zofran, pt refused pain medicine with EMS.

## 2017-03-07 NOTE — ED Provider Notes (Signed)
Emergency Department Provider Note   I have reviewed the triage vital signs and the nursing notes.   HISTORY  Chief Complaint Abdominal Pain   HPI Roy Lloyd is a 26 y.o. 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  Patient states that he had been having feculent smelling burping that has been worsened over this last year or so he came in here about a month ago and was referred to GI which she had a EGD done on the abdominal pain, bloating, feculent vomiting and diarrhea.  No fevers or chest pain.  States he did take a biopsy.  No urinary symptoms.  No rashes. No other associated or modifying symptoms.    Past Medical History:  Diagnosis Date  . Seizures [facility](HCC)     Patient Active Problem List   Diagnosis Date Noted  . Localization-related (focal) (partial) idiopathic epilepsy and epileptic syndromes with seizures of localized onset, intractable, without status epilepticus (HCC) 11/17/2016    Past Surgical History:  Procedure Laterality Date  . COLON SURGERY    . 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 Order #: [id number] Class: Print  . Order #: [id number] Class: Historical Med  . Order #: [id number] Class: Historical Med  . Order #: [id number] Class: Normal  . Order #: [id number] Class: Print  . Order #: [id number] Class: Print  . Order #: [id number] Class: Print    Allergies Patient has no known allergies.  No family history on file.  Social History Social History   Tobacco Use  . Smoking status: Current Every Day Smoker    Packs/day: 0.50    Types: Cigarettes  . Smokeless tobacco: Never Used  Substance Use Topics  . Alcohol use: Yes    Alcohol/week: 0.6 oz    Types: 1 Cans of beer per week  . Drug use: Yes    Types: Marijuana    Review of  Systems  All other systems negative except as documented in the HPI. All pertinent positives and negatives as reviewed in the HPI. ____________________________________________   PHYSICAL EXAM:  VITAL SIGNS: ED Triage Vitals  Enc Vitals Group     BP 03/07/17 1118 (!) 139/94     Pulse Rate 03/07/17 1118 64     Resp 03/07/17 1118 18     Temp 03/07/17 1118 98.7 F (37.1 C)     Temp Source 03/07/17 1118 Oral     SpO2 03/07/17 1118 100 %    Constitutional: Alert and oriented. Well appearing and in no acute distress. Eyes: Conjunctivae are normal. PERRL. EOMI. Head: Atraumatic. Nose: No congestion/rhinnorhea. Mouth/Throat: Mucous membranes are moist.  Oropharynx non-erythematous. Neck: No stridor.  No meningeal signs.   Cardiovascular: Normal rate, regular rhythm. Good peripheral circulation. Grossly normal heart sounds.   Respiratory: Normal respiratory effort.  No retractions. Lungs CTAB. Gastrointestinal: Soft and diffusely tender. No significant distention.  Musculoskeletal: No lower extremity tenderness nor edema. No gross deformities of extremities. Neurologic:  Normal speech and language. No gross focal neurologic deficits are appreciated.  Skin:  Skin is warm, dry and intact. No rash noted.   ____________________________________________   LABS (all labs ordered are listed, but only abnormal results are displayed)  Labs Reviewed  COMPREHENSIVE METABOLIC PANEL - Abnormal; Notable for the following components:      Result Value  CO2 20 (*)    Glucose, Bld 108 (*)    All other components within normal limits  LIPASE, BLOOD  CBC   ____________________________________________  RADIOLOGY  Ct Abdomen Pelvis W Contrast  Result Date: 03/07/2017 CLINICAL DATA:  Abdominal pain. Diarrhea. Bloating and abdominal distension. Abdominal infection. Endoscopy 3 days ago. EXAM: CT ABDOMEN AND PELVIS WITH CONTRAST TECHNIQUE: Multidetector CT imaging of the abdomen and pelvis was  performed using the standard protocol following bolus administration of intravenous contrast. CONTRAST:  100mL ISOVUE-300 IOPAMIDOL (ISOVUE-300) INJECTION 61% COMPARISON:  02/01/2017 FINDINGS: Lower chest: Clear lung bases. Normal heart size without pericardial or pleural effusion. Hepatobiliary: Normal liver. Normal gallbladder, without biliary ductal dilatation. Pancreas: Normal, without mass or ductal dilatation. Spleen: Normal in size, without focal abnormality. Adrenals/Urinary Tract: Normal adrenal glands. Normal kidneys, without hydronephrosis. Normal urinary bladder. Stomach/Bowel: Normal stomach, without wall thickening. Fluid-filled colon, suggesting a diarrheal state. Normal terminal ileum. Appendix is not visualized but there is no evidence of right lower quadrant inflammation. Fluid-filled small bowel loops within the upper abdomen including at 3.4 cm on image 42/series 2. The more distal small bowel is normal in caliber. No areas of small bowel wall thickening or mucosal hyper enhancement identified. Vascular/Lymphatic: Normal caliber of the aorta and branch vessels. No abdominopelvic adenopathy. Reproductive: Normal prostate. Other: No significant free fluid. 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 IMPRESSION: 1. Scattered areas of mildly dilated proximal small bowel, favoring mild ileus as can be seen with gastroenteritis. Lack of transition point argues against low-grade partial small bowel obstruction. 2. Concurrent fluid-filled colon, suggesting a diarrheal state. Electronically Signed   By: Jeronimo GreavesKyle  Talbot M.D.   On: 03/07/2017 13:47    ____________________________________________   INITIAL IMPRESSION / ASSESSMENT AND PLAN / ED COURSE  Possibly enteritis? Will eval further for EGD complications though since this did worsen shortly afterwards.   Does have enteritis.  This in association with recent EGD and his symptoms will treat with ciprofloxacin.  Patient's pain  control this time.  Tolerating p.o.  We will refill his Reglan.  Will need follow-up with his gastroenterologist but no e/o perforation or other complication to warrant hospitalization or further workup at this time.      Pertinent labs & imaging results that were available during my care of the patient were reviewed by me and considered in my medical decision making (see chart for details).  ____________________________________________  FINAL CLINICAL IMPRESSION(S) / ED DIAGNOSES  Final diagnoses:  Enteritis     MEDICATIONS GIVEN DURING THIS VISIT:  Medications  iopamidol (ISOVUE-300) 61 % injection (not administered)  HYDROmorphone (DILAUDID) injection 1 mg (1 mg Intravenous Given 03/07/17 1248)  sodium chloride 0.9 % bolus 1,000 mL (0 mLs Intravenous Stopped 03/07/17 1529)  metoCLOPramide (REGLAN) injection 10 mg (10 mg Intravenous Given 03/07/17 1248)  iopamidol (ISOVUE-300) 61 % injection 100 mL (100 mLs Intravenous Contrast Given 03/07/17 1326)  ciprofloxacin (CIPRO) IVPB 400 mg (0 mg Intravenous Stopped 03/07/17 1529)     NEW OUTPATIENT MEDICATIONS STARTED DURING THIS VISIT:  This SmartLink is deprecated. Use AVSMEDLIST instead to display the medication list for a patient.  Note:  This note was prepared with assistance of Dragon voice recognition software. Occasional wrong-word or sound-a-like substitutions may have occurred due to the inherent limitations of voice recognition software.   Marily MemosMesner, Kristion Holifield, MD 03/07/17 [phone]

## 2017-04-20 ENCOUNTER — Encounter: Payer: BLUE CROSS/BLUE SHIELD | Admitting: Psychology

## 2017-05-04 ENCOUNTER — Encounter: Payer: BLUE CROSS/BLUE SHIELD | Admitting: Psychology

## 2017-05-18 ENCOUNTER — Encounter: Payer: Self-pay | Admitting: Neurology

## 2017-05-31 ENCOUNTER — Other Ambulatory Visit: Payer: Self-pay

## 2017-05-31 ENCOUNTER — Encounter (HOSPITAL_COMMUNITY): Payer: Self-pay

## 2017-05-31 ENCOUNTER — Ambulatory Visit (HOSPITAL_COMMUNITY)
Admission: EM | Admit: 2017-05-31 | Discharge: 2017-05-31 | Disposition: A | Discharge status: Home / Self Care | Payer: BLUE CROSS/BLUE SHIELD

## 2017-05-31 ENCOUNTER — Emergency Department (HOSPITAL_COMMUNITY): Payer: BLUE CROSS/BLUE SHIELD

## 2017-05-31 ENCOUNTER — Emergency Department (HOSPITAL_COMMUNITY)
Admission: EM | Admit: 2017-05-31 | Discharge: 2017-05-31 | Disposition: A | Discharge status: Home / Self Care | Payer: BLUE CROSS/BLUE SHIELD | Attending: Emergency Medicine | Admitting: Emergency Medicine

## 2017-05-31 DIAGNOSIS — R569 Unspecified convulsions: Secondary | ICD-10-CM

## 2017-05-31 DIAGNOSIS — G40019 Localization-related (focal) (partial) idiopathic epilepsy and epileptic syndromes with seizures of localized onset, intractable, without status epilepticus: Secondary | ICD-10-CM

## 2017-05-31 DIAGNOSIS — Y9301 Activity, walking, marching and hiking: Secondary | ICD-10-CM | POA: Diagnosis not present

## 2017-05-31 DIAGNOSIS — F17210 Nicotine dependence, cigarettes, uncomplicated: Secondary | ICD-10-CM | POA: Insufficient documentation

## 2017-05-31 DIAGNOSIS — S0990XA Unspecified injury of head, initial encounter: Secondary | ICD-10-CM | POA: Diagnosis present

## 2017-05-31 DIAGNOSIS — Y929 Unspecified place or not applicable: Secondary | ICD-10-CM | POA: Insufficient documentation

## 2017-05-31 DIAGNOSIS — S0292XA Unspecified fracture of facial bones, initial encounter for closed fracture: Secondary | ICD-10-CM | POA: Insufficient documentation

## 2017-05-31 DIAGNOSIS — Y998 Other external cause status: Secondary | ICD-10-CM | POA: Insufficient documentation

## 2017-05-31 DIAGNOSIS — Z79899 Other long term (current) drug therapy: Secondary | ICD-10-CM | POA: Insufficient documentation

## 2017-05-31 DIAGNOSIS — W01198A Fall on same level from slipping, tripping and stumbling with subsequent striking against other object, initial encounter: Secondary | ICD-10-CM | POA: Insufficient documentation

## 2017-05-31 DIAGNOSIS — W19XXXA Unspecified fall, initial encounter: Secondary | ICD-10-CM

## 2017-05-31 LAB — CBC WITH DIFFERENTIAL/PLATELET
Basophils Absolute: 0 10*3/uL (ref 0.0–0.1)
Basophils Relative: 0 %
EOS PCT: 4 %
Eosinophils Absolute: 0.3 10*3/uL (ref 0.0–0.7)
HEMATOCRIT: 41.6 % (ref 39.0–52.0)
Hemoglobin: 14.7 g/dL (ref 13.0–17.0)
LYMPHS ABS: 2.7 10*3/uL (ref 0.7–4.0)
Lymphocytes Relative: 38 %
MCH: 31.6 pg (ref 26.0–34.0)
MCHC: 35.3 g/dL (ref 30.0–36.0)
MCV: 89.5 fL (ref 78.0–100.0)
MONOS PCT: 8 %
Monocytes Absolute: 0.6 10*3/uL (ref 0.1–1.0)
NEUTROS ABS: 3.5 10*3/uL (ref 1.7–7.7)
Neutrophils Relative %: 50 %
PLATELETS: 256 10*3/uL (ref 150–400)
RBC: 4.65 MIL/uL (ref 4.22–5.81)
RDW: 12.2 % (ref 11.5–15.5)
WBC: 7.1 10*3/uL (ref 4.0–10.5)

## 2017-05-31 LAB — URINALYSIS, ROUTINE W REFLEX MICROSCOPIC
Bilirubin Urine: NEGATIVE
Glucose, UA: NEGATIVE mg/dL
HGB URINE DIPSTICK: NEGATIVE
Ketones, ur: NEGATIVE mg/dL
Leukocytes, UA: NEGATIVE
NITRITE: NEGATIVE
PROTEIN: NEGATIVE mg/dL
SPECIFIC GRAVITY, URINE: 1.018 (ref 1.005–1.030)
pH: 7 (ref 5.0–8.0)

## 2017-05-31 LAB — COMPREHENSIVE METABOLIC PANEL
ALBUMIN: 4 g/dL (ref 3.5–5.0)
ALT: 21 U/L (ref 17–63)
AST: 26 U/L (ref 15–41)
Alkaline Phosphatase: 60 U/L (ref 38–126)
Anion gap: 10 (ref 5–15)
BUN: 11 mg/dL (ref 6–20)
CHLORIDE: 108 mmol/L (ref 101–111)
CO2: 21 mmol/L — AB (ref 22–32)
CREATININE: 1.04 mg/dL (ref 0.61–1.24)
Calcium: 9.4 mg/dL (ref 8.9–10.3)
GFR calc Af Amer: >60 mL/min (ref >60)
GLUCOSE: 81 mg/dL (ref 65–99)
POTASSIUM: 4.1 mmol/L (ref 3.5–5.1)
Sodium: 139 mmol/L (ref 135–145)
Total Bilirubin: 0.7 mg/dL (ref 0.3–1.2)
Total Protein: 6.5 g/dL (ref 6.5–8.1)

## 2017-05-31 LAB — RAPID URINE DRUG SCREEN, HOSP PERFORMED
Amphetamines: NOT DETECTED
BARBITURATES: NOT DETECTED
BENZODIAZEPINES: NOT DETECTED
Cocaine: NOT DETECTED
Opiates: NOT DETECTED
Tetrahydrocannabinol: POSITIVE — AB

## 2017-05-31 LAB — ETHANOL

## 2017-05-31 MED ORDER — OXYCODONE-ACETAMINOPHEN 5-325 MG PO TABS
2.0000 | ORAL_TABLET | Freq: Once | ORAL | Status: AC
  Administered 2017-05-31: 2 via ORAL
  Filled 2017-05-31: qty 2

## 2017-05-31 MED ORDER — CEPHALEXIN 250 MG PO CAPS
1000.0000 mg | ORAL_CAPSULE | Freq: Once | ORAL | Status: AC
  Administered 2017-05-31: 1000 mg via ORAL
  Filled 2017-05-31: qty 4

## 2017-05-31 MED ORDER — HYDROMORPHONE HCL 1 MG/ML IJ SOLN
1.0000 mg | Freq: Once | INTRAMUSCULAR | Status: AC
  Administered 2017-05-31: 1 mg via INTRAVENOUS
  Filled 2017-05-31: qty 1

## 2017-05-31 MED ORDER — IBUPROFEN 800 MG PO TABS: 800.0000 mg | ORAL_TABLET | Freq: Three times a day (TID) | ORAL | 0 refills | Status: DC

## 2017-05-31 MED ORDER — KETOROLAC TROMETHAMINE 30 MG/ML IJ SOLN
30.0000 mg | Freq: Once | INTRAMUSCULAR | Status: AC
  Administered 2017-05-31: 30 mg via INTRAVENOUS
  Filled 2017-05-31: qty 1

## 2017-05-31 MED ORDER — ZONISAMIDE 100 MG PO CAPS: ORAL_CAPSULE | ORAL | 6 refills | Status: DC

## 2017-05-31 MED ORDER — CEPHALEXIN 500 MG PO CAPS: 1000.0000 mg | ORAL_CAPSULE | Freq: Two times a day (BID) | ORAL | 0 refills | Status: DC

## 2017-05-31 MED ORDER — OXYCODONE-ACETAMINOPHEN 5-325 MG PO TABS: 1.0000 | ORAL_TABLET | Freq: Four times a day (QID) | ORAL | 0 refills | Status: DC | PRN

## 2017-05-31 NOTE — ED Notes (Signed)
Patient transported to CT 
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
## 2017-05-31 NOTE — Discharge Instructions (Addendum)
1.  Sleep and rest with your head elevated to at least 30 degrees.  Gently apply an ice pack for 20 minutes every 2 hours.  Make sure your skin is protected.  Do not blow your nose.  Take Keflex as prescribed.  Take ibuprofen every 8 hours for pain.  Take 1-2 Percocet in addition to ibuprofen if needed. 2.  Call Dr. Raye SorrowWolicki's office in the morning to schedule your follow-up appointment. 3.  Call your neurologist to let them know you had a breakthrough seizure.

## 2017-05-31 NOTE — ED Notes (Signed)
ED Provider at bedside. 

## 2017-05-31 NOTE — ED Notes (Signed)
Pt requesting to mother "take pictures so the doctors believe this is happening"

## 2017-06-14 ENCOUNTER — Other Ambulatory Visit: Payer: Self-pay | Admitting: Specialist

## 2017-06-14 DIAGNOSIS — G40109 Localization-related (focal) (partial) symptomatic epilepsy and epileptic syndromes with simple partial seizures, not intractable, without status epilepticus: Secondary | ICD-10-CM

## 2017-06-19 ENCOUNTER — Other Ambulatory Visit: Payer: BLUE CROSS/BLUE SHIELD

## 2017-06-29 ENCOUNTER — Ambulatory Visit: Payer: BLUE CROSS/BLUE SHIELD | Admitting: Neurology

## 2017-11-23 ENCOUNTER — Other Ambulatory Visit: Payer: Self-pay | Admitting: Neurology

## 2017-11-23 DIAGNOSIS — G40019 Localization-related (focal) (partial) idiopathic epilepsy and epileptic syndromes with seizures of localized onset, intractable, without status epilepticus: Secondary | ICD-10-CM

## 2018-06-11 ENCOUNTER — Emergency Department (HOSPITAL_COMMUNITY)
Admission: EM | Admit: 2018-06-11 | Discharge: 2018-06-11 | Disposition: A | Discharge status: Home / Self Care | Payer: BLUE CROSS/BLUE SHIELD | Attending: Emergency Medicine | Admitting: Emergency Medicine

## 2018-06-11 ENCOUNTER — Emergency Department (HOSPITAL_COMMUNITY): Payer: BLUE CROSS/BLUE SHIELD

## 2018-06-11 ENCOUNTER — Other Ambulatory Visit: Payer: Self-pay

## 2018-06-11 DIAGNOSIS — Z79899 Other long term (current) drug therapy: Secondary | ICD-10-CM | POA: Diagnosis not present

## 2018-06-11 DIAGNOSIS — G40909 Epilepsy, unspecified, not intractable, without status epilepticus: Secondary | ICD-10-CM | POA: Insufficient documentation

## 2018-06-11 DIAGNOSIS — F17210 Nicotine dependence, cigarettes, uncomplicated: Secondary | ICD-10-CM | POA: Diagnosis not present

## 2018-06-11 DIAGNOSIS — R569 Unspecified convulsions: Secondary | ICD-10-CM

## 2018-06-11 LAB — COMPREHENSIVE METABOLIC PANEL
ALT: 117 U/L — ABNORMAL HIGH (ref 0–44)
AST: 52 U/L — ABNORMAL HIGH (ref 15–41)
Albumin: 3.9 g/dL (ref 3.5–5.0)
Alkaline Phosphatase: 63 U/L (ref 38–126)
Anion gap: 9 (ref 5–15)
BUN: 8 mg/dL (ref 6–20)
CO2: 24 mmol/L (ref 22–32)
Calcium: 9.3 mg/dL (ref 8.9–10.3)
Chloride: 104 mmol/L (ref 98–111)
Creatinine, Ser: 0.9 mg/dL (ref 0.61–1.24)
GFR calc Af Amer: >60 mL/min (ref >60)
GFR calc non Af Amer: >60 mL/min (ref >60)
Glucose, Bld: 95 mg/dL (ref 70–99)
Potassium: 4.1 mmol/L (ref 3.5–5.1)
Sodium: 137 mmol/L (ref 135–145)
Total Bilirubin: 0.8 mg/dL (ref 0.3–1.2)
Total Protein: 6.4 g/dL — ABNORMAL LOW (ref 6.5–8.1)

## 2018-06-11 LAB — CBC
HCT: 42.6 % (ref 39.0–52.0)
Hemoglobin: 14.4 g/dL (ref 13.0–17.0)
MCH: 31.5 pg (ref 26.0–34.0)
MCHC: 33.8 g/dL (ref 30.0–36.0)
MCV: 93.2 fL (ref 80.0–100.0)
Platelets: 177 10*3/uL (ref 150–400)
RBC: 4.57 MIL/uL (ref 4.22–5.81)
RDW: 13.2 % (ref 11.5–15.5)
WBC: 11.7 10*3/uL — ABNORMAL HIGH (ref 4.0–10.5)
nRBC: 0 % (ref 0.0–0.2)

## 2018-06-11 LAB — CBG MONITORING, ED: Glucose-Capillary: 87 mg/dL (ref 70–99)

## 2018-06-11 LAB — AMMONIA: Ammonia: 29 umol/L (ref 9–35)

## 2018-06-11 MED ORDER — PHENYTOIN SODIUM EXTENDED 100 MG PO CAPS: 100.0000 mg | ORAL_CAPSULE | Freq: Three times a day (TID) | ORAL | 0 refills | Status: DC

## 2018-06-11 MED ORDER — VALPROATE SODIUM 500 MG/5ML IV SOLN
2000.0000 mg | Freq: Once | INTRAVENOUS | Status: AC
  Administered 2018-06-11: 2000 mg via INTRAVENOUS
  Filled 2018-06-11: qty 20

## 2018-06-11 MED ORDER — LORAZEPAM 1 MG PO TABS
1.0000 mg | ORAL_TABLET | Freq: Once | ORAL | Status: AC
  Administered 2018-06-11: 12:00:00 1 mg via ORAL
  Filled 2018-06-11: qty 1

## 2018-06-11 NOTE — Discharge Instructions (Addendum)
IT IS VERY IMPORTANT TO FOLLOW-UP WITH A NEUROLOGIST THIS WEEK.  I'VE DISCUSSED YOUR SEIZURES WITH OUR NEUROLOGIST; YOU'VE BEEN ON MULTIPLE MEDICATIONS IN THE PAST, AND IF YOU DO NOT WANT TO TAKE ANY OF YOUR CURRENTLY PRESCRIBED MEDICATIONS, THE SAFEST MEDICATION TO START WOULD BE DILANTIN 100 MG TID; HOWEVER, YOU HAVE BEEN ON THIS MEDICATION BEFORE AND TAKEN OFF OF IT, SO ITS UNCLEAR IF THIS IS HELPFUL FOR YOU  FOR NOW, I'D RECOMMEND CONTINUING YOUR CURRENT MEDICATION REGIMEN OF ZONISAMIDE AND VIMPAT, PRESCRIBED BY [facility]; IF YOU DO NOT WANT TO TAKE IT, I'VE GIVEN A COURSE OF DILANTIN TO HELP IN THE SHORT TERM, BUT IT'S IMPORTANT TO FOLLOW-UP WITH A NEUROLOGIST AS ABOVE  DO NOT DRINK ALCOHOL WHILE TAKING THE SEIZURE MEDICATIONS  DO NOT DRIVE

## 2018-06-11 NOTE — ED Notes (Signed)
Patient verbalizes understanding of discharge instructions. Opportunity for questioning and answers were provided. 

## 2018-06-11 NOTE — ED Notes (Signed)
Patient transported to CT 

## 2018-06-11 NOTE — Progress Notes (Signed)
Pt's mother, Nazier Gebo, cell: [phone]. Pt and mother aware of no visitor policy.

## 2018-06-11 NOTE — ED Triage Notes (Signed)
Pt here for seizure this morning. No remembrance of event, sts he got up and got in the shower, remembered getting light headed, and then woke up in his bed. He asked his brother who lives with him and he confirmed he had a seizure, stating "we had to pull you out of the bathroom." Tongue trauma present. Pt A/O x 4. Hx seizures, not currently on seizure meds, has tried several without success.

## 2018-06-11 NOTE — ED Provider Notes (Signed)
[facility] EMERGENCY DEPARTMENT Provider Note   CSN: [id number] Arrival date & time: 06/11/18  1126    History   Chief Complaint Chief Complaint  Patient presents with  . Seizures    HPI Roy Lloyd is a 28 y.o. male.     HPI   28 yo M with PMHx seizure d/o here with break through seizure. Pt states that he recently stopped taking all his seizure medications since they "weren't working." He has an extensive h/o seizures since age 52, and has been on keppra, depakote, phenytoin, lacosamide, zonisamide. He has not taken meds in "a while," at least a few weeks. He has been  Having 4-5 seizures/month, most recently this AM. He was on the toilet, next remembered waking up on the ground with mild HA. He noticed petechiae/spotting on his face, and sore R lateral tongue. No open wounds. No dental pain. No loss or bowel or bladder function. Sx similar ot his prior seizures. He smokes MJ but denies other drug use. No recent fever, HA, neck pain, neck stiffness.  Past Medical History:  Diagnosis Date  . Seizures [facility])     Patient Active Problem List   Diagnosis Date Noted  . Localization-related (focal) (partial) idiopathic epilepsy and epileptic syndromes with seizures of localized onset, intractable, without status epilepticus (HCC) 11/17/2016    Past Surgical History:  Procedure Laterality Date  . COLON SURGERY    . 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 Authorizing Provider  cephALEXin (KEFLEX) 500 MG capsule Take 2 capsules (1,000 mg total) by mouth 2 (two) times daily. 05/31/17   Arby Barrette, MD  dicyclomine (BENTYL) 20 MG tablet Take 1 tablet (20 mg total) by mouth 2 (two) times daily. 02/01/17   Joy, Shawn C, PA-C  HYDROcodone-acetaminophen (NORCO/VICODIN) 5-325 MG tablet Take 1 tablet by mouth every 6 (six) hours as  needed for severe pain. 03/07/17   Mesner, Barbara Cower, MD  ibuprofen (ADVIL,MOTRIN) 800 MG tablet Take 1 tablet (800 mg total) by mouth 3 (three) times daily. 05/31/17   Arby Barrette, MD  lacosamide (VIMPAT) 50 MG TABS Take 200 mg by mouth daily.     [provider]  metoCLOPramide (REGLAN) 10 MG tablet Take 1 tablet (10 mg total) by mouth every 6 (six) hours. 03/07/17   Mesner, Barbara Cower, MD  oxyCODONE-acetaminophen (PERCOCET) 5-325 MG tablet Take 1-2 tablets by mouth every 6 (six) hours as needed. 05/31/17   Arby Barrette, MD  phenytoin (DILANTIN) 100 MG ER capsule Take 1 capsule (100 mg total) by mouth 3 (three) times daily for 14 days. 06/11/18 06/25/18  Shaune Pollack, MD  zonisamide (ZONEGRAN) 100 MG capsule Take  3 capsules each night 05/31/17   Van Clines, MD    Family History No family history on file.  Social History Social History   Tobacco Use  . Smoking status: Current Every Day Smoker    Packs/day: 0.50    Types: Cigarettes  . Smokeless tobacco: Never Used  Substance Use Topics  . Alcohol use: Yes    Alcohol/week: 1.0 standard drinks    Types: 1 Cans of beer per week  . Drug use: Yes    Types: Marijuana     Allergies   Patient has no known allergies.   Review of Systems Review of Systems  Constitutional: Negative for chills and fever.  HENT: Negative for ear pain and sore throat.   Eyes: Negative for pain and visual disturbance.  Respiratory: Negative for cough and shortness of breath.   Cardiovascular: Negative for chest pain and palpitations.  Gastrointestinal: Negative for abdominal pain and vomiting.  Genitourinary: Negative for dysuria and hematuria.  Musculoskeletal: Negative for arthralgias and back pain.  Skin: Negative for color change and rash.  Neurological: Positive for seizures and headaches. Negative for syncope.  All other systems reviewed and are negative.    Physical Exam Updated Vital Signs BP 111/74 (BP Location: Right Arm)    Pulse 64   Temp 99.1 F (37.3 C) (Oral)   Resp 19   Ht 5\' 10"  (1.778 m)   Wt 108.9 kg   SpO2 97%   BMI 34.44 kg/m   Physical Exam Vitals signs and nursing note reviewed.  Constitutional:      General: He is not in acute distress.    Appearance: He is well-developed.  HENT:     Head: Normocephalic and atraumatic.     Comments: Superficial abrasion R lateral tongue, no deep lacerations or bleeding. Scattered periorbital and bilateral malar cheek petechiae, no purpura. No dental trauma. Eyes:     Conjunctiva/sclera: Conjunctivae normal.  Neck:     Musculoskeletal: Neck supple.  Cardiovascular:     Rate and Rhythm: Normal rate and regular rhythm.     Heart sounds: Normal heart sounds. No murmur. No friction rub.  Pulmonary:     Effort: Pulmonary effort is normal. No respiratory distress.     Breath sounds: Normal breath sounds. No wheezing or rales.  Abdominal:     General: There is no distension.     Palpations: Abdomen is soft.     Tenderness: There is no abdominal tenderness.  Skin:    General: Skin is warm.     Capillary Refill: Capillary refill takes less than 2 seconds.  Neurological:     Mental Status: He is alert and oriented to person, place, and time.     Motor: No abnormal muscle tone.     Neurological Exam:  Mental Status: Alert and oriented to person, place, and time. Attention and concentration normal. Speech clear. Recent memory is intact. Cranial Nerves: Visual fields grossly intact. EOMI and PERRLA. No nystagmus noted. Facial sensation intact at forehead, maxillary cheek, and chin/mandible bilaterally. No facial asymmetry or weakness. Hearing grossly normal. Uvula is midline, and palate elevates symmetrically. Normal SCM and trapezius strength. Tongue midline without fasciculations. Motor: Muscle strength 5/5 in proximal and distal UE and LE bilaterally. No pronator drift. Muscle tone normal. Reflexes: 2+ and symmetrical in all four extremities.  Sensation:  Intact to light touch in upper and lower extremities distally bilaterally.  Gait: Normal without ataxia. Coordination: Normal FTN bilaterally.    ED Treatments / Results  Labs (all labs ordered are listed, but only abnormal results are displayed) Labs Reviewed  CBC - Abnormal; Notable for the following components:      Result Value   WBC 11.7 (*)    All other components within normal limits  COMPREHENSIVE METABOLIC PANEL - Abnormal; Notable for the following components:   Total Protein 6.4 (*)    AST 52 (*)    ALT 117 (*)    All other components within normal limits  AMMONIA  CBG MONITORING, ED    EKG EKG Interpretation  Date/Time:  Saturday June 11 2018 11:40:34 EDT Ventricular Rate:  97 PR Interval:  QRS Duration: 98 QT Interval:  331 QTC Calculation: 421 R Axis:   77 Text Interpretation:  Sinus rhythm Since last EKG, rate is increased ECG OTHERWISE WITHIN NORMAL LIMITS Confirmed by Shaune Pollack [phone]) on 06/11/2018 11:55:14 AM   Radiology Ct Head Wo Contrast  Result Date: 06/11/2018 CLINICAL DATA:  Seizure this morning.  Posttraumatic headache EXAM: CT HEAD WITHOUT CONTRAST TECHNIQUE: Contiguous axial images were obtained from the base of the skull through the vertex without intravenous contrast. COMPARISON:  05/31/2017 FINDINGS: Brain: No evidence of acute infarction, hemorrhage, hydrocephalus, extra-axial collection or mass lesion/mass effect. Vascular: No hyperdense vessel or unexpected calcification. Skull: Normal. Negative for fracture or focal lesion. Sinuses/Orbits: No acute finding. IMPRESSION: Negative head CT Electronically Signed   By: Marnee Spring M.D.   On: 06/11/2018 12:32    Procedures Procedures (including critical care time)  Medications Ordered in ED Medications  LORazepam (ATIVAN) tablet 1 mg (1 mg Oral Given 06/11/18 1148)  valproate (DEPACON) 2,000 mg in dextrose 5 % 50 mL IVPB (0 mg Intravenous Stopped 06/11/18 1444)     Initial  Impression / Assessment and Plan / ED Course  I have reviewed the triage vital signs and the nursing notes.  Pertinent labs & imaging results that were available during my care of the patient were reviewed by me and considered in my medical decision making (see chart for details).  Clinical Course as of Jun 10 1940  Sat Jun 11, 2018  1325 28 yo M here with seizure, h/o same. He is off his meds because they reportedly did not work. No EToh use. No infectious sx or apparent recent triggers. CT head neg. Labs reassuring.   [CI]    Clinical Course User Index [CI] Shaune Pollack, MD       Discussed case w/ Dr. Otelia Limes. Pt given IV Depakote load here. However, he subsequently was noted to have minimal transaminitis. While this is likely safe, will hold on additional doses of depakote at home. Per Dr. Otelia Limes, will instead offer a second trial of dilantin at 100 mg TID for now, with instructions to f/u as an outpatient with his Neurologist. I discussed that he has a very complicated epilepsy history with pt, and that he should follow the advice of his current neurologist. However, he refuses to take his current regimen, so will offer him the alternative of dilantin while arranging with a new neurologist. No driving, seizure precautions discussed.  Final Clinical Impressions(s) / ED Diagnoses   Final diagnoses:  Seizure [facility])    ED Discharge Orders         Ordered    phenytoin (DILANTIN) 100 MG ER capsule  3 times daily     06/11/18 1613           Shaune Pollack, MD 06/11/18 1942

## 2018-06-15 ENCOUNTER — Other Ambulatory Visit: Payer: Self-pay

## 2018-06-15 ENCOUNTER — Emergency Department (HOSPITAL_COMMUNITY)
Admission: EM | Admit: 2018-06-15 | Discharge: 2018-06-15 | Disposition: A | Discharge status: Home / Self Care | Payer: Medicaid Other | Attending: Emergency Medicine | Admitting: Emergency Medicine

## 2018-06-15 ENCOUNTER — Encounter (HOSPITAL_COMMUNITY): Payer: Self-pay | Admitting: Emergency Medicine

## 2018-06-15 DIAGNOSIS — F17210 Nicotine dependence, cigarettes, uncomplicated: Secondary | ICD-10-CM | POA: Diagnosis not present

## 2018-06-15 DIAGNOSIS — R590 Localized enlarged lymph nodes: Secondary | ICD-10-CM

## 2018-06-15 DIAGNOSIS — R1909 Other intra-abdominal and pelvic swelling, mass and lump: Secondary | ICD-10-CM | POA: Diagnosis present

## 2018-06-15 DIAGNOSIS — Z79899 Other long term (current) drug therapy: Secondary | ICD-10-CM | POA: Insufficient documentation

## 2018-06-15 NOTE — Discharge Instructions (Addendum)
Return here as needed.  Take Tylenol and Motrin for any discomfort.  You can also use heat over the area.

## 2018-06-15 NOTE — ED Provider Notes (Signed)
[facility]ERGENC[facility] Provider Note   CSN: [id number] Arrival date & time: 06/15/18  1841    History   Chief Complaint Chief Complaint  Patient presents with  . Inguinal Hernia    HPI Roy Lloyd is a 28 y.o. male.     HPI Patient presents to the emergency department with a swollen area in the left inguinal region.  The patient was concerned that he could have a hernia or a lymph node.  The patient states that he is currently having an outbreak of genital herpes.  Patient states the area is tender to the palpation.  The patient has no scrotal pain or penile discharge.  Patient denies nausea, vomiting, abdominal pain, weakness, dizziness, fever, dysuria, incontinence or syncope. Past Medical History:  Diagnosis Date  . Seizures [facility](HCC)     Patient Active Problem List   Diagnosis Date Noted  . Localization-related (focal) (partial) idiopathic epilepsy and epileptic syndromes with seizures of localized onset, intractable, without status epilepticus (HCC) 11/17/2016    Past Surgical History:  Procedure Laterality Date  . COLON SURGERY    . 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 Authorizing Provider  cephALEXin (KEFLEX) 500 MG capsule Take 2 capsules (1,000 mg total) by mouth 2 (two) times daily. 05/31/17   Arby BarrettePfeiffer, Marcy, MD  dicyclomine (BENTYL) 20 MG tablet Take 1 tablet (20 mg total) by mouth 2 (two) times daily. 02/01/17   Joy, Shawn C, PA-C  HYDROcodone-acetaminophen (NORCO/VICODIN) 5-325 MG tablet Take 1 tablet by mouth every 6 (six) hours as needed for severe pain. 03/07/17   Mesner, Barbara CowerJason, MD  ibuprofen (ADVIL,MOTRIN) 800 MG tablet Take 1 tablet (800 mg total) by mouth 3 (three) times daily. 05/31/17   Arby BarrettePfeiffer, Marcy, MD  lacosamide (VIMPAT) 50 MG TABS Take 200 mg by mouth daily.     [provider]  metoCLOPramide (REGLAN) 10 MG tablet Take 1 tablet (10 mg total) by mouth every 6 (six) hours. 03/07/17   Mesner, Barbara CowerJason, MD  oxyCODONE-acetaminophen (PERCOCET) 5-325 MG tablet Take 1-2 tablets by mouth every 6 (six) hours as needed. 05/31/17   Arby BarrettePfeiffer, Marcy, MD  phenytoin (DILANTIN) 100 MG ER capsule Take 1 capsule (100 mg total) by mouth 3 (three) times daily for 14 days. 06/11/18 06/25/18  Shaune PollackIsaacs, Cameron, MD  zonisamide (ZONEGRAN) 100 MG capsule Take  3 capsules each night 05/31/17   Van ClinesAquino, Karen M, MD    Family History No family history on file.  Social History Social History   Tobacco Use  . Smoking status: Current Every Day Smoker    Packs/day: 0.50    Types: Cigarettes  . Smokeless tobacco: Never Used  Substance Use Topics  . Alcohol use: Yes    Alcohol/week: 1.0 standard drinks    Types: 1 Cans of beer per week  . Drug use: Yes    Types: Marijuana     Allergies   Patient has no known allergies.   Review of Systems Review of Systems  All other systems negative except as documented in the HPI. All pertinent positives and negatives as reviewed in the HPI. Physical Exam Updated Vital Signs BP (!) 138/98 (BP Location: Right Arm)   Pulse 91   Temp 99.2 F (37.3 C) (Oral)   Resp 16   SpO2 97%  Physical Exam Vitals signs and nursing note reviewed.  Constitutional:      General: He is not in acute distress.    Appearance: He is well-developed.  HENT:     Head: Normocephalic and atraumatic.  Eyes:     Pupils: Pupils are equal, round, and reactive to light.  Cardiovascular:     Rate and Rhythm: Normal rate and regular rhythm.  Pulmonary:     Effort: Pulmonary effort is normal.     Breath sounds: Normal breath sounds.  Abdominal:     General: Bowel sounds are normal.     Palpations: Abdomen is soft.  Lymphadenopathy:     Lower Body: Left inguinal adenopathy present.  Skin:    General: Skin is warm and dry.  Neurological:     Mental Status: He is alert  and oriented to person, place, and time.      ED Treatments / Results  Labs (all labs ordered are listed, but only abnormal results are displayed) Labs Reviewed - No data to display  EKG None  Radiology No results found.  Procedures Procedures (including critical care time)  Medications Ordered in ED Medications - No data to display   Initial Impression / Assessment and Plan / ED Course  I have reviewed the triage vital signs and the nursing notes.  Pertinent labs & imaging results that were available during my care of the patient were reviewed by me and considered in my medical decision making (see chart for details).        Patient has an isolated left inguinal lymph node that is slightly enlarged.  This is most likely due to his genital herpes outbreak.  Have advised the patient to return here as needed.  Told to follow-up with his doctor. Final Clinical Impressions(s) / ED Diagnoses   Final diagnoses:  None    ED Discharge Orders    None       Charlestine Night, PA-C 06/15/18 1951    Virgina Norfolk, DO 06/15/18 2326

## 2018-06-15 NOTE — ED Triage Notes (Signed)
Pt states he was seen here on 4/4 for a seizure which he believes caused him to have a inguinal hernia. Pt reports it is very tender to touch and rates pain at 5/10.

## 2018-06-30 ENCOUNTER — Telehealth: Payer: Self-pay

## 2018-06-30 NOTE — Telephone Encounter (Signed)
I contacted the pt who was scheduled for a new pt appt in June and offered an earlier virtual video visit with Dr. Anne Hahn on 07/04/18 at 2 pm and the pt accepted.   Pt understands that although there may be some limitations with this type of visit, we will take all precautions to reduce any security or privacy concerns.  Pt understands that this will be treated like an in office visit and we will file with pt's insurance, and there may be a patient responsible charge related to this service.  Pt's email is [email or web address]. Pt understands that the cisco webex software must be downloaded and operational on the device pt plans to use for the visit.  Pt has been referred by Dr. Antony Haste for seizures.  Pt has tried Dilantin, Vimpat and Zonegran in the past and has been d/c off.  Pt reports he currently is not taking any prescription medications at this time.   I have updated the pt's medications, allergies and PMH.

## 2018-07-04 ENCOUNTER — Ambulatory Visit (INDEPENDENT_AMBULATORY_CARE_PROVIDER_SITE_OTHER): Payer: Medicaid Other | Admitting: Neurology

## 2018-07-04 ENCOUNTER — Encounter: Payer: Self-pay | Admitting: Neurology

## 2018-07-04 ENCOUNTER — Other Ambulatory Visit: Payer: Self-pay

## 2018-07-04 DIAGNOSIS — R569 Unspecified convulsions: Secondary | ICD-10-CM | POA: Insufficient documentation

## 2018-07-04 MED ORDER — CLOBAZAM 10 MG PO TABS: ORAL_TABLET | ORAL | 3 refills | Status: DC
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
## 2018-07-06 ENCOUNTER — Telehealth: Payer: Self-pay | Admitting: Neurology

## 2018-07-06 MED ORDER — CLOBAZAM 10 MG PO TABS: ORAL_TABLET | ORAL | 3 refills | Status: DC

## 2018-07-06 NOTE — Addendum Note (Signed)
Addended by: York Spaniel on: 07/06/2018 04:30 PM   Modules accepted: Orders

## 2018-07-06 NOTE — Telephone Encounter (Signed)
I called the patient, left a message, I did send in the prescription for Onfi 2 days ago, it was sent into the [facility] store, I will resend the prescription.  They are to contact me if they never got the prescription.

## 2018-07-06 NOTE — Telephone Encounter (Signed)
Patient stated he spoke with Dr. Anne Hahn about two different types of medicine for his seizure and he went to [facility] today but they informed him nothing has been sent in yet.

## 2018-07-07 ENCOUNTER — Telehealth: Payer: Self-pay

## 2018-07-07 NOTE — Telephone Encounter (Signed)
Patient was able to pick up the generic form of onfi on 07/06/18. PA for brand name onfi is not needed at this time.

## 2018-07-07 NOTE — Telephone Encounter (Signed)
[employer] called in wanting to know why the patient cant take the generic med

## 2018-07-07 NOTE — Telephone Encounter (Signed)
PA for Onfi 10 mg has been sent via cover my meds.  (Key: [id number])   Your information has been submitted to [facility] [place]. [employer] [place] will review the request and fax you a determination directly, typically within 3 business days of your submission once all necessary information is received. If [employer] [place] has not responded in 3 business days or if you have any questions about your submission, contact [employer] [place] at [phone].

## 2018-07-07 NOTE — Telephone Encounter (Signed)
Patient was able to pick the generic form of onfi (clobazam) up on 07/06/18 at 8:30 pm. No PA needed.

## 2018-07-07 NOTE — Telephone Encounter (Signed)
[facility] has stated Onfi is in need of a PA. I have submitted the PA through [facility] and PA is pending. I have notified the pt vm PA is pending.

## 2018-08-15 ENCOUNTER — Ambulatory Visit: Payer: Medicaid Other | Admitting: Neurology

## 2018-08-25 ENCOUNTER — Telehealth: Payer: Self-pay | Admitting: Neurology

## 2018-08-25 NOTE — Telephone Encounter (Signed)
He can go ahead and increase the Onfi to 10 mg bid at this point. Please advise patient or family/caretaker to call 911, If he were to have a prolonged seizure of more than 5 minutes.

## 2018-08-25 NOTE — Telephone Encounter (Signed)
Pt wanted to inform the provider that even on his cloBAZam (ONFI) 10 MG tablet he had a seizure yesterday.Pt states he took the medication as instructed and religiously. FYI

## 2018-08-25 NOTE — Telephone Encounter (Signed)
I called pt, advised him to increase Onfi to 10mg  BID. If seizure lasts more than 5 mins, family/caretaker should call 911. Pt verbalized understanding.

## 2018-08-25 NOTE — Telephone Encounter (Signed)
I called pt. He has been taking clobazam 1/2 tablet in the AM and 1 tablet in the PM daily for about one month. He had a seizure yesterday. He is wondering if another anti-epileptic should be added to his regimen as discussed with Dr. Jannifer Franklin. He reports that CBD oil "is crap" and it didn't work. He is aware that Dr. Jannifer Franklin is out of the office today.

## 2018-09-08 ENCOUNTER — Telehealth: Payer: Self-pay | Admitting: Neurology

## 2018-09-08 MED ORDER — CARBAMAZEPINE 200 MG PO TABS: ORAL_TABLET | ORAL | 3 refills | Status: DC

## 2018-09-08 NOTE — Telephone Encounter (Signed)
Pt returning call please call back °

## 2018-09-08 NOTE — Telephone Encounter (Signed)
I called the patient.  The patient had a seizure on 17 June and then another seizure this morning.  He is attending to have 1 or 2 seizures a month.  The Onfi is well-tolerated but has not really made much of a difference in his seizure frequency.  He has been on multiple seizure medications in the past without significant benefit.  He believes that the use of Keppra actually worsened the frequency of his seizures.  We will try low-dose carbamazepine at this time.  Lyrica could be tried in the past, the patient has also been on Lamictal which seemed to help initially but the effect seemed to wear off after time.

## 2018-09-08 NOTE — Telephone Encounter (Signed)
Pt is calling in to state he had another seizure

## 2018-09-08 NOTE — Telephone Encounter (Signed)
Pt returned my call. He reported a seizure this morning when he woke up. Pt states he was sleep deprived and feels like this triggered the seizure. Pt states he thinks his mom witnessed the seizure and was 1-2 mins long.  Pt reports he is taking 1 10 mg Onfi tab in the am and 1 in the pm.  Pt states he did not miss his medication and is unaware of changes in the makeup of the medication. Pt was advised to try and rest, stay well hydrated and continue medication as well.  Pt was also was advised MD would be notified to see if any other recommendations should be made. Pt was agreeable.

## 2018-09-08 NOTE — Addendum Note (Signed)
Addended by: Kathrynn Ducking on: 09/08/2018 05:37 PM   Modules accepted: Orders

## 2018-09-08 NOTE — Telephone Encounter (Signed)
I contacted the pt and lvm asking him to call me back. Pt was provided with [facility] # and hours.

## 2018-09-08 NOTE — Telephone Encounter (Signed)
Left second vm for the pt.  

## 2018-09-11 ENCOUNTER — Other Ambulatory Visit (HOSPITAL_COMMUNITY): Payer: Self-pay | Admitting: Neurology

## 2018-09-11 ENCOUNTER — Telehealth (HOSPITAL_COMMUNITY): Payer: Self-pay | Admitting: Neurology

## 2018-09-11 MED ORDER — CLOBAZAM 10 MG PO TABS: ORAL_TABLET | ORAL | 3 refills | Status: DC

## 2018-09-11 NOTE — Progress Notes (Signed)
I returned call from pt`s mom to answering service stating he ran out of clobazam 10 mg twice daily and needed new prescription. I verified his chart and Dr Tobey Grim plan in epic and did e scribe for clobazam 10 mg twice daily # 60 with 3 refills.

## 2018-10-27 ENCOUNTER — Telehealth: Payer: Self-pay | Admitting: Neurology
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
## 2018-10-27 NOTE — Telephone Encounter (Signed)
Pt's mother called wanting RN to call her back and discuss the pt's seizure activity and medications. Pt had a seizure last month and just had one about an hour ago. Please advise.

## 2018-11-01 ENCOUNTER — Ambulatory Visit (INDEPENDENT_AMBULATORY_CARE_PROVIDER_SITE_OTHER): Payer: BLUE CROSS/BLUE SHIELD | Admitting: Neurology

## 2018-11-01 ENCOUNTER — Other Ambulatory Visit: Payer: Self-pay

## 2018-11-01 ENCOUNTER — Encounter: Payer: Self-pay | Admitting: Neurology

## 2018-11-01 VITALS — BP 130/88 | HR 74 | Temp 98.6°F | Ht 70.0 in | Wt 210.0 lb

## 2018-11-01 DIAGNOSIS — R569 Unspecified convulsions: Secondary | ICD-10-CM

## 2018-11-01 MED ORDER — LACOSAMIDE 50 MG PO TABS: ORAL_TABLET | ORAL | 1 refills | Status: DC

## 2018-11-01 NOTE — Patient Instructions (Signed)
We will start Vimpat 50 mg twice a day and increase the dose over time.

## 2018-11-01 NOTE — Progress Notes (Signed)
Reason for visit: Seizures  Roy DarnerRobert W Lloyd is an 28 y.o. male  History of present illness:  Roy Lloyd is a 28 year old right-handed white male with a history of intractable seizures.  The patient recent was on Onfi and carbamazepine combination, he had a seizure on 27 October 2018 and another seizure on 23 August.  At that point, he decided to stop his anticonvulsant medications cold [place].  He is now on no antiepileptic medications whatsoever.  During the seizure, the episode lasted about 5 minutes associated with tongue biting and postictal confusion.  The patient may at times have drop attacks with the seizures.  He does not operate a motor vehicle.  He returns for an evaluation.  In the past, he has had the best control when using Lamictal and Vimpat.  Past Medical History:  Diagnosis Date  . Anxiety   . Fecal incontinence   . Genital herpes   . Hirschsprung's disease   . Seizures (HCC)     Past Surgical History:  Procedure Laterality Date  . COLON SURGERY    . 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 Seizures Mother   . Seizures Paternal Grandmother     Social history:  reports that he has quit smoking. His smoking use included cigarettes. He smoked 0.50 packs per day. He has quit using smokeless tobacco. He reports previous alcohol use of about 1.0 standard drinks of alcohol per week. He reports current drug use. Drug: Marijuana.   No Known Allergies  Medications:  Prior to Admission medications   Medication Sig Start Date End Date Taking? Authorizing Provider  carbamazepine (TEGRETOL) 200 MG tablet 1/2 tablet twice daily for 2 weeks and then take 1 full tablet twice daily Patient not taking: Reported on 11/01/2018 09/08/18   York SpanielWillis, Gemini Beaumier K, MD  cloBAZam (ONFI) 10 MG tablet 1 full tablet twice daily Patient not taking: Reported on 11/01/2018 09/11/18   Micki RileySethi, Pramod S, MD  valACYclovir  (VALTREX) 1000 MG tablet Take 1,000 mg by mouth 2 (two) times daily.    [provider]    ROS:  Out of a complete 14 system review of symptoms, the patient complains only of the following symptoms, and all other reviewed systems are negative.  Seizures  Blood pressure 130/88, pulse 74, temperature 98.6 F (37 C), height 5\' 10"  (1.778 m), weight 210 lb (95.3 kg).  Physical Exam  General: The patient is alert and cooperative at the time of the examination.  Skin: No significant peripheral edema is noted.   Neurologic Exam  Mental status: The patient is alert and oriented x 3 at the time of the examination. The patient has apparent normal recent and remote memory, with an apparently normal attention span and concentration ability.   Cranial nerves: Facial symmetry is present. Speech is normal, no aphasia or dysarthria is noted. Extraocular movements are full. Visual fields are full.  Motor: The patient has good strength in all 4 extremities.  Sensory examination: Soft touch sensation is symmetric on the face, arms, and legs.  Coordination: The patient has good finger-nose-finger and heel-to-shin bilaterally.  Gait and station: The patient has a normal gait. Tandem gait is normal. Romberg is negative. No drift is seen.  Reflexes: Deep tendon reflexes are symmetric.   Assessment/Plan:  1.  Intractable seizures  The patient continues to have generalized seizure events.  He unfortunately has  stopped his Onfi and carbamazepine suddenly.  We will get him back on Vimpat working up rapidly to 150 mg twice daily, he will call me at that point and then I will add Lamictal to his dosing regimen.  I have also recommended that he try CBD oil on a regular basis.  The patient will follow-up here in October, we will check blood levels at that time.  He is not to go off any anticonvulsant medication suddenly.  Jill Alexanders MD 11/01/2018 12:28 PM  [facility]  [address] [place], [place] [address]  Phone [phone] Fax [phone]

## 2018-11-02 IMAGING — CR DG ABDOMEN ACUTE W/ 1V CHEST
3 series · 3 of 3 positions shown · non-contrast
Comparison: CT abdomen and pelvis 08/21/2016

CLINICAL DATA: Generalized abdominal pain since last night.
Vomiting. Diarrhea.

EXAM:
DG ABDOMEN ACUTE W/ 1V CHEST

[chest pa]
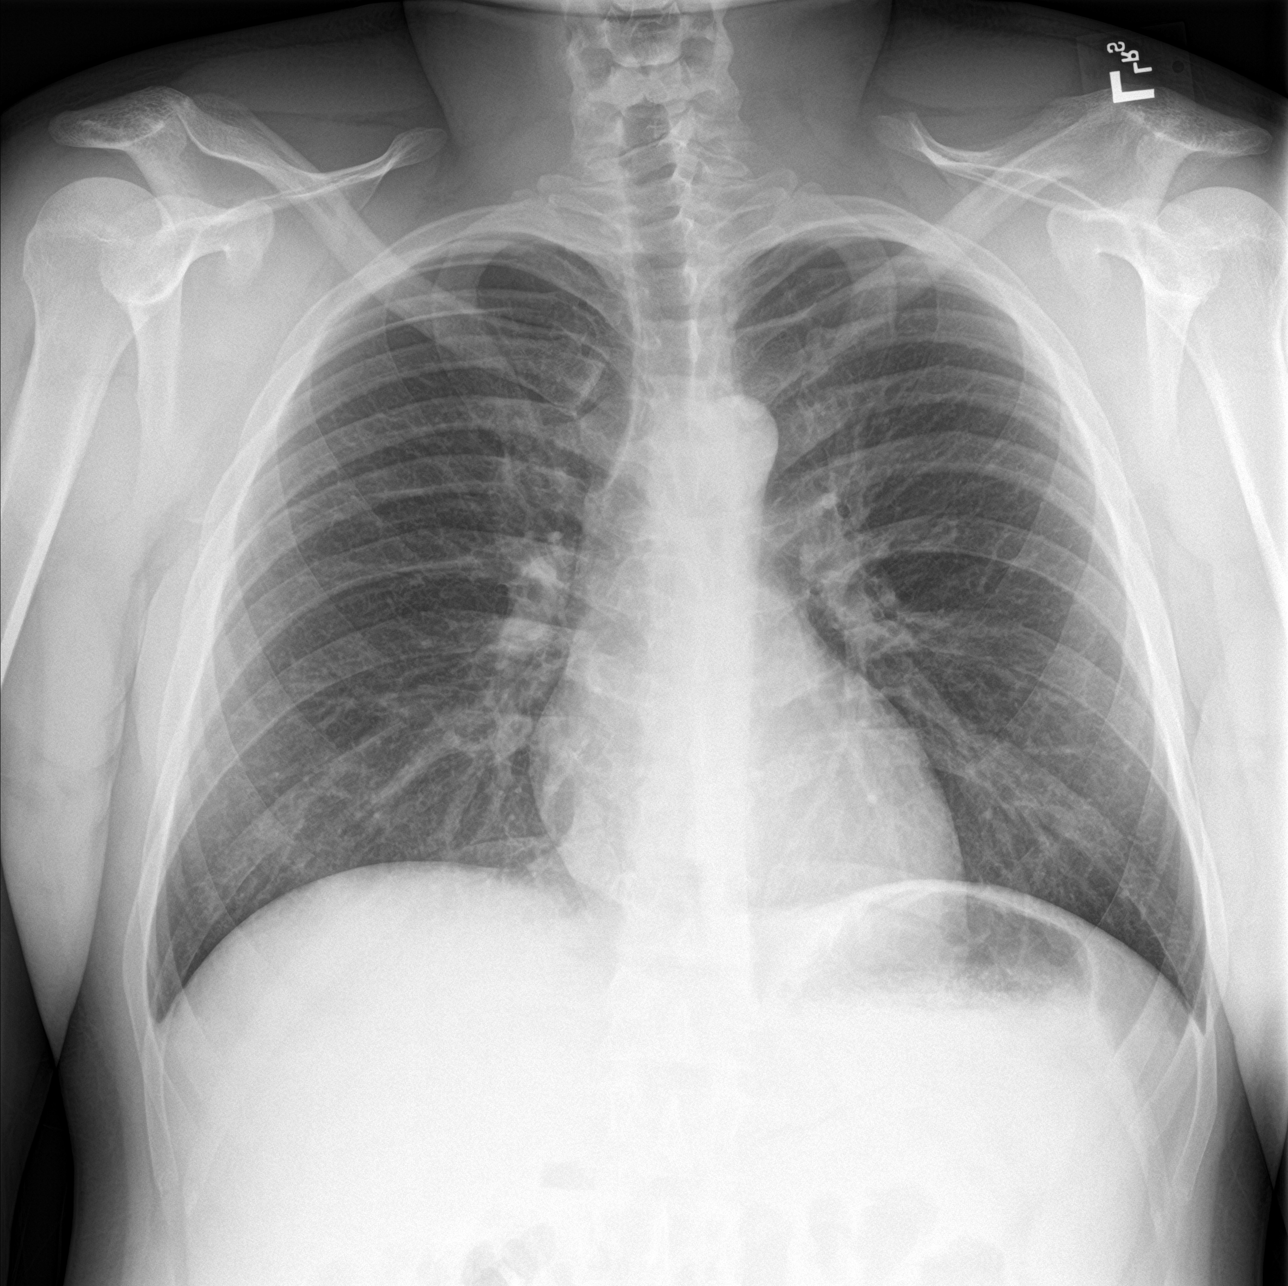

[abdomen erect]
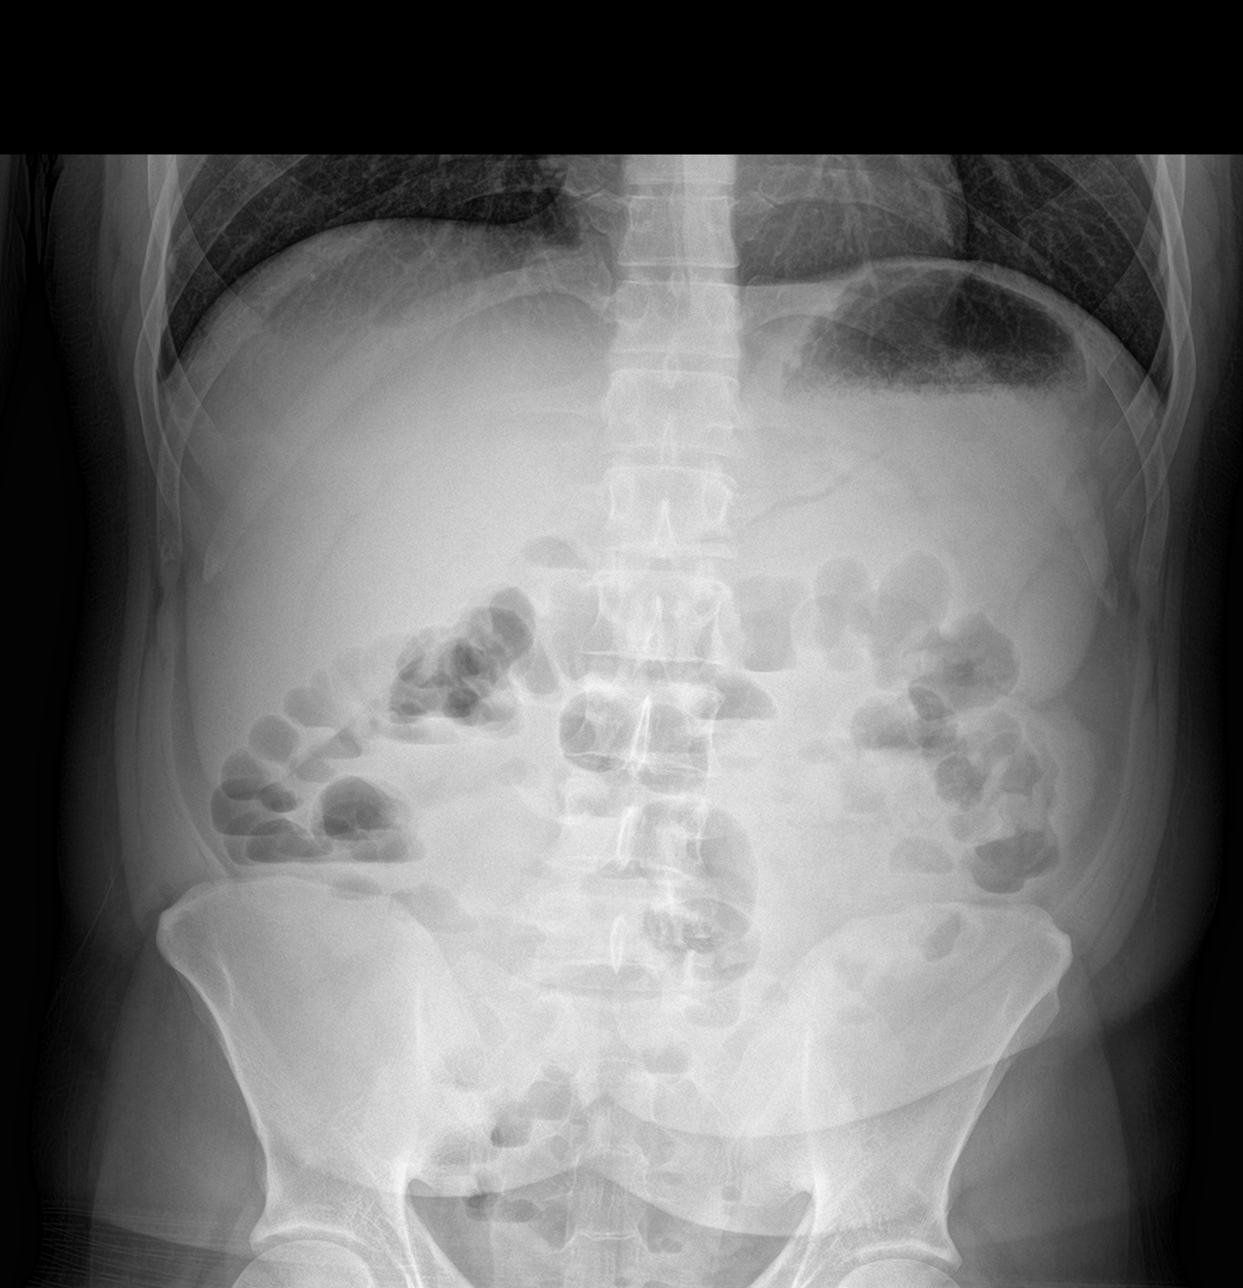

[abdomen supine]
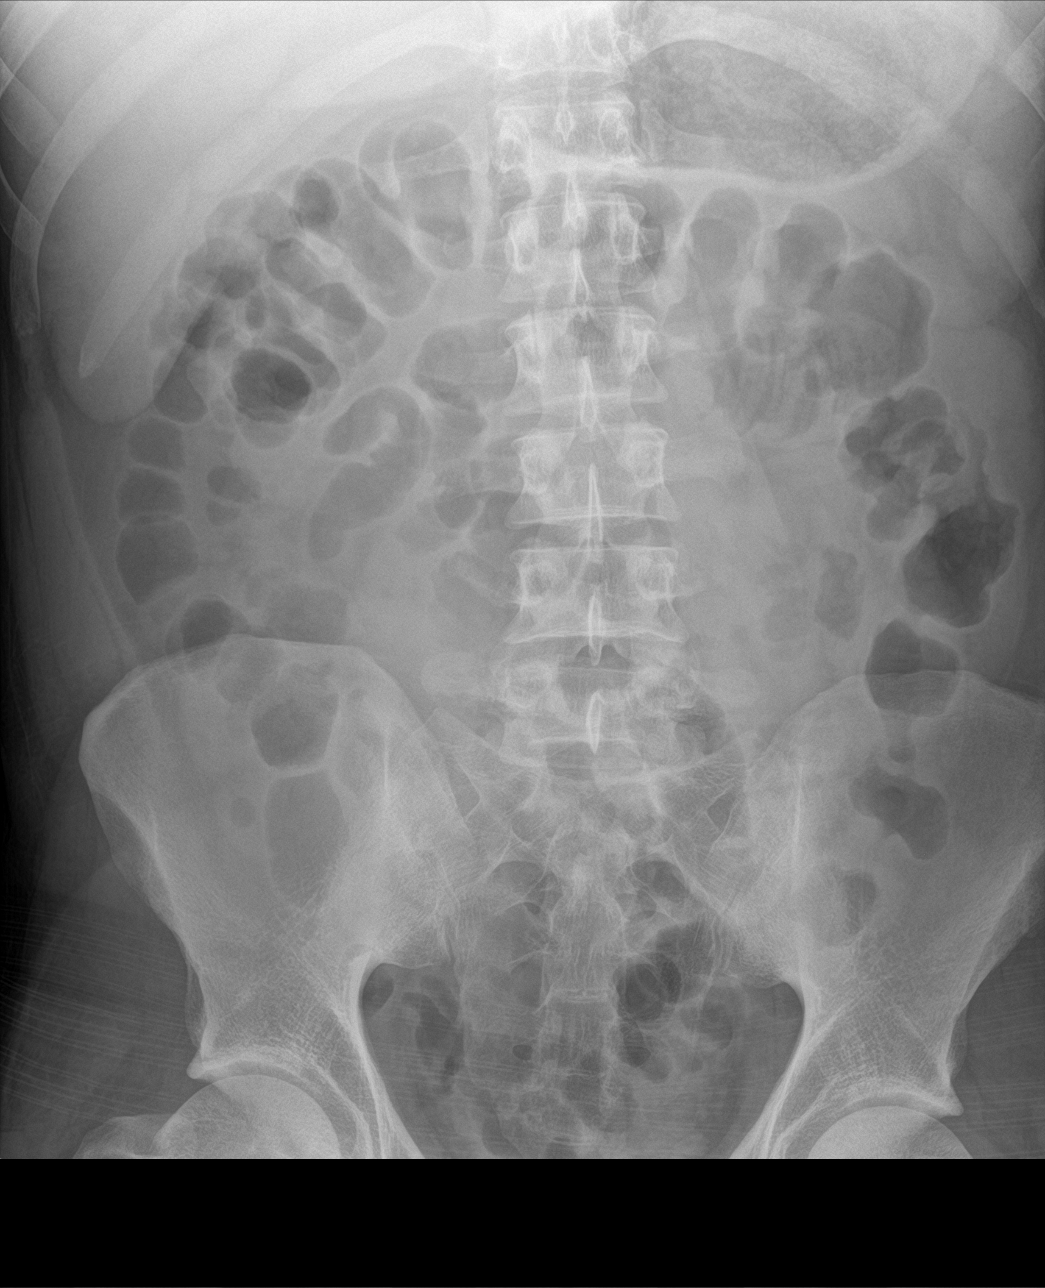

[3 of 3 positions shown; findings below may reference images not displayed]

FINDINGS: Normal heart size and pulmonary vascularity. No focal airspace
disease or consolidation in the lungs. No blunting of costophrenic
angles. No pneumothorax. Mediastinal contours appear intact.

Scattered gas and stool in the colon. Scattered gas in the small
bowel. No small or large bowel distention. No free intra-abdominal
air. No abnormal air-fluid levels. No radiopaque stones. Visualized
bones appear intact.
IMPRESSION: No evidence of active pulmonary disease. Nonobstructive bowel gas
pattern.

## 2018-11-17 ENCOUNTER — Telehealth: Payer: Self-pay | Admitting: Neurology

## 2018-11-17 MED ORDER — VIMPAT 150 MG PO TABS: 150.0000 mg | ORAL_TABLET | Freq: Two times a day (BID) | ORAL | 3 refills | Status: DC

## 2018-11-17 MED ORDER — LAMOTRIGINE 25 MG PO TABS: ORAL_TABLET | ORAL | 1 refills | Status: DC

## 2018-11-17 NOTE — Telephone Encounter (Signed)
FYI-Pt called to inform the provider that he had a seizure the other day while being on the medication. Did not go to the ER, family was present.

## 2018-11-17 NOTE — Telephone Encounter (Signed)
I called the patient.  The patient is on Vimpat 150 mg twice daily, for some reason he picked up another prescription for Onfi which she had stopped.  Our plans are to go on Lamictal, we will start this medication now.  Eventually we may go higher on the Vimpat dose as well.

## 2018-12-08 MED ORDER — LACOSAMIDE 200 MG PO TABS: 200.0000 mg | ORAL_TABLET | Freq: Two times a day (BID) | ORAL | 1 refills | Status: AC

## 2018-12-08 NOTE — Telephone Encounter (Signed)
I called the patient.  Patient likely had a significant generalized seizure this morning, he has petechial hemorrhages on the face.  He is still working up on the Lamictal taking 50 mg twice daily, when he is on 75 mg twice daily for 2 weeks he will call me and we will convert to the 100 mg tablets.  I will go up on the Vimpat to 200 mg twice daily now.

## 2018-12-08 NOTE — Telephone Encounter (Signed)
I called the pt. He reports he had a seizure this morning between 7-8 am. He reports he did not miss his medications dosages. Pt would like to discuss with MD and would like to discuss if epilepsy needs to be placed as a dx in in chart. Best call back is [phone].

## 2018-12-08 NOTE — Addendum Note (Signed)
Addended by: Kathrynn Ducking on: 12/08/2018 11:51 AM   Modules accepted: Orders

## 2018-12-08 NOTE — Telephone Encounter (Signed)
FYI- Pt called in to state he has had another seizure

## 2018-12-14 ENCOUNTER — Encounter: Payer: Self-pay | Admitting: Neurology

## 2018-12-14 ENCOUNTER — Ambulatory Visit (INDEPENDENT_AMBULATORY_CARE_PROVIDER_SITE_OTHER): Payer: Medicare Other | Admitting: Neurology

## 2018-12-14 ENCOUNTER — Other Ambulatory Visit: Payer: Self-pay

## 2018-12-14 ENCOUNTER — Encounter

## 2018-12-14 VITALS — BP 136/76 | HR 78 | Temp 96.9°F | Ht 70.0 in | Wt 204.3 lb

## 2018-12-14 DIAGNOSIS — R569 Unspecified convulsions: Secondary | ICD-10-CM | POA: Diagnosis not present

## 2018-12-14 MED ORDER — LAMOTRIGINE 100 MG PO TABS: 100.0000 mg | ORAL_TABLET | Freq: Two times a day (BID) | ORAL | 0 refills | Status: DC

## 2018-12-14 MED ORDER — SERTRALINE HCL 50 MG PO TABS: ORAL_TABLET | ORAL | 3 refills | Status: AC

## 2018-12-14 MED ORDER — LAMOTRIGINE 150 MG PO TABS: 150.0000 mg | ORAL_TABLET | Freq: Every day | ORAL | 2 refills | Status: DC

## 2018-12-14 NOTE — Progress Notes (Signed)
Reason for visit: Seizures, intractable  Roy Lloyd is an 28 y.o. male  History of present illness:  Roy Lloyd is a 28 year old right-handed white male with a history of intractable seizures.  The patient is on Vimpat taking 200 mg twice daily, he is ramping up on his Lamictal dose, currently at 75 mg twice daily.  He is tolerating medications fairly well, he continues to have seizures unfortunately.  He does not operate a motor vehicle.  He smokes marijuana on a daily basis, he claims he does this for anxiety issues.  Most of his seizures occur in the morning time.  He returns to this office for an evaluation.  Past Medical History:  Diagnosis Date  . Anxiety   . Fecal incontinence   . Genital herpes   . Hirschsprung's disease   . Seizures (HCC)     Past Surgical History:  Procedure Laterality Date  . COLON SURGERY    . 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 Seizures Mother   . Seizures Paternal Grandmother     Social history:  reports that he has quit smoking. His smoking use included cigarettes. He smoked 0.50 packs per day. He has quit using smokeless tobacco. He reports previous alcohol use of about 1.0 standard drinks of alcohol per week. He reports current drug use. Drug: Marijuana.   No Known Allergies  Medications:  Prior to Admission medications   Medication Sig Start Date End Date Taking? Authorizing Provider  lacosamide (VIMPAT) 200 MG TABS tablet Take 1 tablet (200 mg total) by mouth 2 (two) times daily. 12/08/18  Yes Roy Spaniel, Roy Lloyd  lamoTRIgine (LAMICTAL) 25 MG tablet 1 tablet twice daily for 2 weeks then take 2 tablets twice daily for 2 weeks, then take 3 tablets twice daily 11/17/18  Yes Roy Spaniel, Roy Lloyd  valACYclovir (VALTREX) 1000 MG tablet Take 1,000 mg by mouth 2 (two) times daily.   Yes Provider, Historical, Roy Lloyd    ROS:  Out of a complete 14 system  review of symptoms, the patient complains only of the following symptoms, and all other reviewed systems are negative.  Seizures Anxiety  Blood pressure 136/76, pulse 78, temperature (!) 96.9 F (36.1 C), temperature source Temporal, height 5\' 10"  (1.778 m), weight 204 lb 5 oz (92.7 kg).  Physical Exam  General: The patient is alert and cooperative at the time of the examination.  Skin: No significant peripheral edema is noted.   Neurologic Exam  Mental status: The patient is alert and oriented x 3 at the time of the examination. The patient has apparent normal recent and remote memory, with an apparently normal attention span and concentration ability.   Cranial nerves: Facial symmetry is present. Speech is normal, no aphasia or dysarthria is noted. Extraocular movements are full. Visual fields are full.  Motor: The patient has good strength in all 4 extremities.  Sensory examination: Soft touch sensation is symmetric on the face, arms, and legs.  Coordination: The patient has good finger-nose-finger and heel-to-shin bilaterally.  Gait and station: The patient has a normal gait. Tandem gait is normal. Romberg is negative. No drift is seen.  Reflexes: Deep tendon reflexes are symmetric.   Assessment/Plan:  1.  Intractable seizures  2.  Anxiety  3.  Daily marijuana use  I have indicated that marijuana may have a negative effect on seizure control.  The patient is having a lot of anxiety, we will start Zoloft taking 50 mg daily for a week and then go to 100 mg daily.  The Lamictal be increased to 100 mg twice daily for 2 weeks and then go to the 150 mg twice daily, I will check blood work in 5 to 6 weeks.  We will try to maximize the Lamictal and Vimpat.  If this is not effective for the seizures, we may add another medication such as Xcopri.  He will follow-up otherwise in 4 months.   Jill Alexanders Roy Lloyd 12/14/2018 12:25 PM  [facility] [address] [place], [place] [address]  Phone [phone] Fax [phone]

## 2018-12-14 NOTE — Patient Instructions (Signed)
We will start Lamicatal 100 mg twice a day for 2 weeks, then convert to lamictal 150 mg twice a day.

## 2019-01-28 ENCOUNTER — Telehealth: Payer: Self-pay | Admitting: Neurology

## 2019-01-28 DIAGNOSIS — Z5181 Encounter for therapeutic drug level monitoring: Secondary | ICD-10-CM

## 2019-01-28 NOTE — Telephone Encounter (Signed)
I called the patient.  He is to come in next week to get blood work for Lamictal and Vimpat levels, we will make dose adjustments depending upon the results of this.  We need to maximize the levels of both these medications.

## 2019-02-07 ENCOUNTER — Other Ambulatory Visit: Payer: Self-pay

## 2019-02-07 ENCOUNTER — Telehealth: Payer: Self-pay | Admitting: Neurology

## 2019-02-07 ENCOUNTER — Other Ambulatory Visit: Payer: Self-pay | Admitting: Neurology

## 2019-02-07 ENCOUNTER — Other Ambulatory Visit (INDEPENDENT_AMBULATORY_CARE_PROVIDER_SITE_OTHER): Payer: Self-pay

## 2019-02-07 DIAGNOSIS — Z0289 Encounter for other administrative examinations: Secondary | ICD-10-CM

## 2019-02-07 DIAGNOSIS — Z5181 Encounter for therapeutic drug level monitoring: Secondary | ICD-10-CM

## 2019-02-07 MED ORDER — LAMOTRIGINE 150 MG PO TABS: 150.0000 mg | ORAL_TABLET | Freq: Every day | ORAL | 3 refills | Status: DC

## 2019-02-07 NOTE — Telephone Encounter (Signed)
The chart was reviewed with the patient.  He should be taking Vimpat 200mg  BID and Lamictal 150mg  BID.  The pharmacy gave him old doses of both prescriptions (Vimpat 150mg  and Lamictal 100mg ).  They told him the other doses were not on file.  I called [facility] [phone]) and spoke to pharmacist, Katharine Look.  She confirmed that they do have the correct prescriptions.  She get them both ready for pick up.  She will close out the other doses to avoid any further confusion.  The patient is aware the prescriptions are ready for pick up,

## 2019-02-07 NOTE — Telephone Encounter (Signed)
Pt is asking for a call from RN to discuss how the Keppra and lacosamide (VIMPAT) 200 MG TABS tablet have not had an impact on his seizure activity.  Please call

## 2019-02-07 NOTE — Addendum Note (Signed)
Addended by: Noberto Retort C on: 02/07/2019 12:08 PM   Modules accepted: Orders

## 2019-02-07 NOTE — Telephone Encounter (Signed)
Pt called stating that he is in the pharmacy now and they are informing him that he does not have anymore refills left on his lamoTRIgine (LAMICTAL) 150 MG tablet and that the office needs to call it in for him. Please advise.

## 2019-02-07 NOTE — Telephone Encounter (Signed)
Refills sent to the pharmacy.  The patient is aware.

## 2019-02-09 LAB — LAMOTRIGINE LEVEL: Lamotrigine Lvl: 3.9 ug/mL (ref 2.0–20.0)

## 2019-02-09 LAB — LACOSAMIDE: Lacosamide: 6.1 ug/mL (ref 5.0–10.0)

## 2019-02-13 ENCOUNTER — Telehealth: Payer: Self-pay | Admitting: *Deleted

## 2019-02-13 NOTE — Telephone Encounter (Signed)
-----   Message from Melvenia Beam, MD sent at 02/11/2019  8:25 AM EST ----- Levels within normal ranges thanks

## 2019-02-13 NOTE — Telephone Encounter (Signed)
Called, LVM (ok per DPR) relaying lab levels within normal levels. Gave [facility] phone number if he has further questions/concerns.

## 2019-02-20 DIAGNOSIS — Z0289 Encounter for other administrative examinations: Secondary | ICD-10-CM

## 2019-02-28 ENCOUNTER — Telehealth: Payer: Self-pay | Admitting: Neurology

## 2019-02-28 DIAGNOSIS — G40909 Epilepsy, unspecified, not intractable, without status epilepticus: Secondary | ICD-10-CM

## 2019-02-28 MED ORDER — LAMOTRIGINE 150 MG PO TABS: 150.0000 mg | ORAL_TABLET | Freq: Two times a day (BID) | ORAL | 3 refills | Status: AC

## 2019-02-28 NOTE — Addendum Note (Signed)
Addended by: Kathrynn Ducking on: 02/28/2019 05:20 PM   Modules accepted: Orders
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
## 2019-02-28 NOTE — Telephone Encounter (Signed)
Patient returned call , he states that he has already increased his dosage to 200mg  bid on Lamictal and Vimpat .  Patient is requesting a call back to discuss
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
## 2019-03-06 ENCOUNTER — Telehealth: Payer: Self-pay | Admitting: Neurology

## 2019-03-06 NOTE — Telephone Encounter (Signed)
Patient is scheduled at [facility] with Dr. Matilde Bash 03/08/2019 arrive at 9:45 am for 10:00 apt.  Telephone [phone] - fax [phone]  [facility]  4 th floor [place]

## 2019-05-08 ENCOUNTER — Ambulatory Visit: Payer: Medicare Other | Admitting: Neurology

## 2019-05-08 ENCOUNTER — Encounter: Payer: Self-pay | Admitting: Neurology

## 2019-05-08 ENCOUNTER — Telehealth: Payer: Self-pay | Admitting: Neurology

## 2019-05-08 NOTE — Telephone Encounter (Signed)
This patient did not show for a revisit on today.
